# Patient Record
Sex: Male | Born: 1990 | Race: Black or African American | Hispanic: No | Marital: Single | Smoking: Current every day smoker
Health system: Southern US, Community
[De-identification: ages and names within clinical notes are randomized; demographics above are authoritative.]

## PROBLEM LIST (undated history)

## (undated) DIAGNOSIS — R569 Unspecified convulsions: Secondary | ICD-10-CM

---
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
## 2012-06-16 ENCOUNTER — Emergency Department: Payer: Self-pay | Admitting: Emergency Medicine

## 2014-05-05 ENCOUNTER — Emergency Department: Payer: Self-pay | Admitting: Internal Medicine

## 2014-11-12 ENCOUNTER — Emergency Department (HOSPITAL_COMMUNITY): Payer: Self-pay

## 2014-11-12 ENCOUNTER — Emergency Department (HOSPITAL_COMMUNITY)
Admission: EM | Admit: 2014-11-12 | Discharge: 2014-11-13 | Disposition: A | Discharge status: Home / Self Care | Payer: Self-pay | Attending: Emergency Medicine | Admitting: Emergency Medicine

## 2014-11-12 ENCOUNTER — Encounter (HOSPITAL_COMMUNITY): Payer: Self-pay | Admitting: Emergency Medicine

## 2014-11-12 DIAGNOSIS — Y998 Other external cause status: Secondary | ICD-10-CM | POA: Insufficient documentation

## 2014-11-12 DIAGNOSIS — Z720 Tobacco use: Secondary | ICD-10-CM | POA: Insufficient documentation

## 2014-11-12 DIAGNOSIS — S00511A Abrasion of lip, initial encounter: Secondary | ICD-10-CM | POA: Insufficient documentation

## 2014-11-12 DIAGNOSIS — Y9389 Activity, other specified: Secondary | ICD-10-CM | POA: Insufficient documentation

## 2014-11-12 DIAGNOSIS — Y9259 Other trade areas as the place of occurrence of the external cause: Secondary | ICD-10-CM | POA: Insufficient documentation

## 2014-11-12 DIAGNOSIS — W07XXXA Fall from chair, initial encounter: Secondary | ICD-10-CM | POA: Insufficient documentation

## 2014-11-12 DIAGNOSIS — R569 Unspecified convulsions: Secondary | ICD-10-CM | POA: Insufficient documentation

## 2014-11-12 DIAGNOSIS — F1210 Cannabis abuse, uncomplicated: Secondary | ICD-10-CM | POA: Insufficient documentation

## 2014-11-12 DIAGNOSIS — S0083XA Contusion of other part of head, initial encounter: Secondary | ICD-10-CM | POA: Insufficient documentation

## 2014-11-12 DIAGNOSIS — F1310 Sedative, hypnotic or anxiolytic abuse, uncomplicated: Secondary | ICD-10-CM | POA: Insufficient documentation

## 2014-11-12 DIAGNOSIS — R001 Bradycardia, unspecified: Secondary | ICD-10-CM | POA: Insufficient documentation

## 2014-11-12 LAB — I-STAT CHEM 8, ED
BUN: 10 mg/dL (ref 6–20)
Calcium, Ion: 1.18 mmol/L (ref 1.12–1.23)
Chloride: 101 mmol/L (ref 101–111)
Creatinine, Ser: 1.1 mg/dL (ref 0.61–1.24)
GLUCOSE: 98 mg/dL (ref 65–99)
HEMATOCRIT: 46 % (ref 39.0–52.0)
HEMOGLOBIN: 15.6 g/dL (ref 13.0–17.0)
Potassium: 3.7 mmol/L (ref 3.5–5.1)
SODIUM: 140 mmol/L (ref 135–145)
TCO2: 22 mmol/L (ref 0–100)

## 2014-11-12 LAB — CBC WITH DIFFERENTIAL/PLATELET
BASOS PCT: 0 % (ref 0–1)
Basophils Absolute: 0 10*3/uL (ref 0.0–0.1)
EOS PCT: 2 % (ref 0–5)
Eosinophils Absolute: 0.1 10*3/uL (ref 0.0–0.7)
HEMATOCRIT: 42.5 % (ref 39.0–52.0)
HEMOGLOBIN: 14.8 g/dL (ref 13.0–17.0)
LYMPHS ABS: 0.7 10*3/uL (ref 0.7–4.0)
Lymphocytes Relative: 10 % — ABNORMAL LOW (ref 12–46)
MCH: 30.4 pg (ref 26.0–34.0)
MCHC: 34.8 g/dL (ref 30.0–36.0)
MCV: 87.3 fL (ref 78.0–100.0)
MONO ABS: 0.4 10*3/uL (ref 0.1–1.0)
Monocytes Relative: 6 % (ref 3–12)
Neutro Abs: 5.7 10*3/uL (ref 1.7–7.7)
Neutrophils Relative %: 83 % — ABNORMAL HIGH (ref 43–77)
Platelets: 117 10*3/uL — ABNORMAL LOW (ref 150–400)
RBC: 4.87 MIL/uL (ref 4.22–5.81)
RDW: 13.1 % (ref 11.5–15.5)
WBC: 7 10*3/uL (ref 4.0–10.5)

## 2014-11-12 LAB — RAPID URINE DRUG SCREEN, HOSP PERFORMED
Amphetamines: NOT DETECTED
BARBITURATES: NOT DETECTED
BENZODIAZEPINES: POSITIVE — AB
COCAINE: NOT DETECTED
Opiates: NOT DETECTED
TETRAHYDROCANNABINOL: POSITIVE — AB

## 2014-11-12 LAB — ETHANOL

## 2014-11-12 MED ORDER — SODIUM CHLORIDE 0.9 % IV BOLUS (SEPSIS)
1000.0000 mL | Freq: Once | INTRAVENOUS | Status: AC
Start: 2014-11-12 — End: 2014-11-13
  Administered 2014-11-12: 1000 mL via INTRAVENOUS

## 2014-11-12 NOTE — Discharge Instructions (Signed)
Bradycardia Bradycardia is a term for a heart rate (pulse) that, in adults, is slower than 60 beats per minute. A normal rate is 60 to 100 beats per minute. A heart rate below 60 beats per minute may be normal for some adults with healthy hearts. If the rate is too slow, the heart may have trouble pumping the volume of blood the body needs. If the heart rate gets too low, blood flow to the brain may be decreased and may make you feel lightheaded, dizzy, or faint. The heart has a natural pacemaker in the top of the heart called the SA node (sinoatrial or sinus node). This pacemaker sends out regular electrical signals to the muscle of the heart, telling the heart muscle when to beat (contract). The electrical signal travels from the upper parts of the heart (atria) through the AV node (atrioventricular node), to the lower chambers of the heart (ventricles). The ventricles squeeze, pumping the blood from your heart to your lungs and to the rest of your body. CAUSES   Problem with the heart's electrical system.  Problem with the heart's natural pacemaker.  Heart disease, damage, or infection.  Medications.  Problems with minerals and salts (electrolytes). SYMPTOMS   Fainting (syncope).  Fatigue and weakness.  Shortness of breath (dyspnea).  Chest pain (angina).  Drowsiness.  Confusion. DIAGNOSIS   An electrocardiogram (ECG) can help your caregiver determine the type of slow heart rate you have.  If the cause is not seen on an ECG, you may need to wear a heart monitor that records your heart rhythm for several hours or days.  Blood tests. TREATMENT   Electrolyte supplements.  Medications.  Withholding medication which is causing a slow heart rate.  Pacemaker placement. SEEK IMMEDIATE MEDICAL CARE IF:   You feel lightheaded or faint.  You develop an irregular heart rate.  You feel chest pain or have trouble breathing. MAKE SURE YOU:   Understand these  instructions.  Will watch your condition.  Will get help right away if you are not doing well or get worse. Document Released: 02/21/2002 Document Revised: 08/24/2011 Document Reviewed: 09/06/2013 [facility] Patient Information 2015 [place], [place]. This information is not intended to replace advice given to you by your health care provider. Make sure you discuss any questions you have with your health care provider. No definitive cause for your seizure was identified tonight, you had a normal head CT your lab work was all normal you were noted to have some bradycardia or slow heart rate U been given a referral to cardiology.  Please call in the morning and make an appointment if you develop new or worsening symptoms between now and your appointment time.  Please return to the emergency room for further evaluation

## 2014-11-12 NOTE — ED Notes (Addendum)
Herbert Griffin(mom): [phone].

## 2014-11-12 NOTE — ED Notes (Signed)
Per EMS, [place] when his he had a tonic-clonic seizure, lasting approximately 1 minute. This was witnessed by a friend. Upon EMS arrival, pt was diaphoretic and post-ictal. Pt has a hematoma on his right forehead, and EMS reports that the pt had a bloody nose. Pt denies nausea and vomiting.

## 2014-11-12 NOTE — ED Provider Notes (Signed)
CSN: [id number]     Arrival date & time 11/12/14  2002 History   First MD Initiated Contact with Patient 11/12/14 2007     Chief Complaint  Patient presents with  . Seizures     (Consider location/radiation/quality/duration/timing/severity/associated sxs/prior Treatment) HPI Comments: By EMS after friends called for reported seizuretoday.  [place] the [place] and he fell sideways out of his chair had generalized body shaking lasting approximately 1 unit.  It took approximately 30-45 minutes to come back to baseline.  On arrival to the emergency department.  He is at his baseline.  He states that he does not have a history of seizures.  He states that he has not had a lot to eat or drink today.  He may be dehydrated.  He denies regular daily drinking regularly daily drug use.  States last he had any Beer was several days ago and he smokes marijuana several times a week. Patient denies any family history of cardiac disease, diabetes, seizure disorder.  Sister pulled me aside after initial interview and states that the patient uses marijuana on a daily basis as well as street drugs that he buys daily.  He drinks alcohol and beer on a daily basis.  He's been doing this for several years.  Patient is a 24 y.o. male presenting with seizures. The history is provided by the patient and a friend.  Seizures Seizure activity on arrival: no   Seizure type:  Myoclonic Initial focality:  Unable to specify Episode characteristics: confusion, disorientation and generalized shaking   Episode characteristics: no incontinence and no tongue biting   Postictal symptoms: confusion   Return to baseline: yes   Severity:  Unable to specify Duration:  1 minute Timing:  Once Progression:  Resolved Recent head injury:  No recent head injuries History of seizures: no     History reviewed. No pertinent past medical history. History reviewed. No pertinent past surgical history. History reviewed. No  pertinent family history. History  Substance Use Topics  . Smoking status: Current Every Day Smoker  . Smokeless tobacco: Not on file  . Alcohol Use: Yes     Comment: Occassionally    Review of Systems  Constitutional: Negative for fever.  Respiratory: Negative for shortness of breath.   Cardiovascular: Negative for chest pain.  Gastrointestinal: Negative for nausea, vomiting and abdominal pain.  Skin: Positive for wound.  Neurological: Positive for seizures. Negative for dizziness, weakness, numbness and headaches.  All other systems reviewed and are negative.     Allergies  Review of patient's allergies indicates no known allergies.  Home Medications   Prior to Admission medications   Not on File   BP 125/79 mmHg  Pulse 51  Temp(Src) 98.4 F (36.9 C) (Oral)  Resp 14  SpO2 100% Physical Exam  Constitutional: He is oriented to person, place, and time. He appears well-developed and well-nourished.  HENT:  Right Ear: External ear normal.  Left Ear: External ear normal.  Mouth/Throat: Oropharynx is clear and moist.  Eyes: Pupils are equal, round, and reactive to light.  Neck: Normal range of motion. No spinous process tenderness and no muscular tenderness present. Normal range of motion present.  Cardiovascular: Regular rhythm.  Bradycardia present.   Pulmonary/Chest: Effort normal and breath sounds normal.  Abdominal: Soft. He exhibits no distension. There is no tenderness.  Musculoskeletal: Normal range of motion. He exhibits no edema or tenderness.  Lymphadenopathy:    He has no cervical adenopathy.  Neurological: He  is alert and oriented to person, place, and time.  Skin: Skin is warm.  Hematoma right upper forehead abrasion to upper lip midline  Nursing note and vitals reviewed.   ED Course  Procedures (including critical care time) Labs Review Labs Reviewed  CBC WITH DIFFERENTIAL/PLATELET - Abnormal; Notable for the following:    Platelets 117 (*)     Neutrophils Relative % 83 (*)    Lymphocytes Relative 10 (*)    All other components within normal limits  URINE RAPID DRUG SCREEN (HOSP PERFORMED) NOT AT [facility] - Abnormal; Notable for the following:    Benzodiazepines POSITIVE (*)    Tetrahydrocannabinol POSITIVE (*)    All other components within normal limits  ETHANOL  I-STAT CHEM 8, ED    Imaging Review Ct Head Wo Contrast  11/12/2014   CLINICAL DATA:  Witnessed tonic colonic seizure for 1 minute.  EXAM: CT HEAD WITHOUT CONTRAST  TECHNIQUE: Contiguous axial images were obtained from the base of the skull through the vertex without intravenous contrast.  COMPARISON:  None.  FINDINGS: No intracranial hemorrhage, mass effect, or midline shift. No hydrocephalus. The basilar cisterns are patent. No evidence of territorial infarct. No intracranial fluid collection. Small right frontal scalp hematoma. Calvarium is intact. There is diffuse mucosal thickening of paranasal sinuses with circumferential involvement of the right maxillary sinus, small fluid level in the left side of sphenoid sinus and left maxillary sinus. Mastoid air cells are well aerated.  IMPRESSION: 1. No acute intracranial abnormality. Small right frontal scalp hematoma without fracture. 2. Near pan sinusitis, with likely acute component on the left.   Electronically Signed   By: Rubye Oaks M.D.   On: 11/12/2014 22:15     EKG Interpretation   Date/Time:  Monday Nov 12 2014 20:06:58 EDT Ventricular Rate:  48 PR Interval:  178 QRS Duration: 85 QT Interval:  398 QTC Calculation: 355 R Axis:   64 Text Interpretation:  Age not entered, assumed to be  24 years old for  purpose of ECG interpretation Sinus bradycardia Consider left ventricular  hypertrophy Minimal ST elevation, inferior leads No prior  for comparison  Confirmed by Gwendolyn Grant  MD, BLAIR ([id number]) on 11/12/2014 8:20:14 PM     Patient with no history of seizures was brought in after having a witnessed seizure lasted  approximately 1 minute with a 45 minute recovery.  Negative workup in the emergency department, although he was found to be bradycardic with pulses dropping into the 30s, although he was asymptomatic.  He has been given a referral to cardiology for follow-up.  Patient understands this. MDM   Final diagnoses:  Seizure  Bradycardia         Earley Favor, NP 11/13/14 0002  Elwin Mocha, MD 11/13/14 [phone]

## 2014-11-14 ENCOUNTER — Telehealth (HOSPITAL_BASED_OUTPATIENT_CLINIC_OR_DEPARTMENT_OTHER): Payer: Self-pay | Admitting: Emergency Medicine

## 2015-10-30 ENCOUNTER — Emergency Department
Admission: EM | Admit: 2015-10-30 | Discharge: 2015-10-30 | Disposition: A | Discharge status: Home / Self Care | Payer: Self-pay | Attending: Emergency Medicine | Admitting: Emergency Medicine

## 2015-10-30 ENCOUNTER — Emergency Department: Payer: Self-pay

## 2015-10-30 ENCOUNTER — Encounter: Payer: Self-pay | Admitting: *Deleted

## 2015-10-30 DIAGNOSIS — F1290 Cannabis use, unspecified, uncomplicated: Secondary | ICD-10-CM | POA: Insufficient documentation

## 2015-10-30 DIAGNOSIS — F17200 Nicotine dependence, unspecified, uncomplicated: Secondary | ICD-10-CM | POA: Insufficient documentation

## 2015-10-30 DIAGNOSIS — R569 Unspecified convulsions: Secondary | ICD-10-CM | POA: Insufficient documentation

## 2015-10-30 HISTORY — DX: Unspecified convulsions: R56.9

## 2015-10-30 LAB — BASIC METABOLIC PANEL
Anion gap: 11 (ref 5–15)
BUN: 24 mg/dL — AB (ref 6–20)
CALCIUM: 9.2 mg/dL (ref 8.9–10.3)
CHLORIDE: 102 mmol/L (ref 101–111)
CO2: 23 mmol/L (ref 22–32)
Creatinine, Ser: 1.34 mg/dL — ABNORMAL HIGH (ref 0.61–1.24)
GFR calc non Af Amer: >60 mL/min (ref >60)
Glucose, Bld: 105 mg/dL — ABNORMAL HIGH (ref 65–99)
Potassium: 4.3 mmol/L (ref 3.5–5.1)
Sodium: 136 mmol/L (ref 135–145)

## 2015-10-30 LAB — CBC WITH DIFFERENTIAL/PLATELET
BASOS PCT: 1 %
Basophils Absolute: 0 10*3/uL (ref 0–0.1)
EOS ABS: 0.4 10*3/uL (ref 0–0.7)
EOS PCT: 6 %
HCT: 43.2 % (ref 40.0–52.0)
Hemoglobin: 14.3 g/dL (ref 13.0–18.0)
LYMPHS ABS: 1.6 10*3/uL (ref 1.0–3.6)
Lymphocytes Relative: 23 %
MCH: 29.1 pg (ref 26.0–34.0)
MCHC: 33.1 g/dL (ref 32.0–36.0)
MCV: 88.1 fL (ref 80.0–100.0)
Monocytes Absolute: 0.6 10*3/uL (ref 0.2–1.0)
Monocytes Relative: 9 %
Neutro Abs: 4.3 10*3/uL (ref 1.4–6.5)
Neutrophils Relative %: 61 %
PLATELETS: 120 10*3/uL — AB (ref 150–440)
RBC: 4.91 MIL/uL (ref 4.40–5.90)
RDW: 14.6 % — ABNORMAL HIGH (ref 11.5–14.5)
WBC: 6.9 10*3/uL (ref 3.8–10.6)

## 2015-10-30 MED ORDER — LEVETIRACETAM 500 MG PO TABS: 500.0000 mg | ORAL_TABLET | Freq: Two times a day (BID) | ORAL | Status: DC

## 2015-10-30 MED ORDER — LEVETIRACETAM 500 MG/5ML IV SOLN
1000.0000 mg | Freq: Once | INTRAVENOUS | Status: AC
  Administered 2015-10-30: 1000 mg via INTRAVENOUS
  Filled 2015-10-30: qty 10

## 2015-10-30 MED ORDER — SODIUM CHLORIDE 0.9 % IV BOLUS (SEPSIS)
1000.0000 mL | Freq: Once | INTRAVENOUS | Status: AC
  Administered 2015-10-30: 1000 mL via INTRAVENOUS

## 2015-10-30 NOTE — ED Notes (Signed)
Patient transported to CT 
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
## 2015-10-30 NOTE — Discharge Instructions (Signed)
Do not drive until you are cleared to do so by your neurologist. °Seizure, Adult °A seizure is abnormal electrical activity in the brain. Seizures usually last from 30 seconds to 2 minutes. There are various types of seizures. °Before a seizure, you may have a warning sensation (aura) that a seizure is about to occur. An aura may include the following symptoms:  °· Fear or anxiety. °· Nausea. °· Feeling like the room is spinning (vertigo). °· Vision changes, such as seeing flashing lights or spots. °Common symptoms during a seizure include: °· A change in attention or behavior (altered mental status). °· Convulsions with rhythmic jerking movements. °· Drooling. °· Rapid eye movements. °· Grunting. °· Loss of bladder and bowel control. °· Bitter taste in the mouth. °· Tongue biting. °After a seizure, you may feel confused and sleepy. You may also have an injury resulting from convulsions during the seizure. °HOME CARE INSTRUCTIONS  °· If you are given medicines, take them exactly as prescribed by your health care provider. °· Keep all follow-up appointments as directed by your health care provider. °· Do not swim or drive or engage in risky activity during which a seizure could cause further injury to you or others until your health care provider says it is OK. °· Get adequate rest. °· Teach friends and family what to do if you have a seizure. They should: °¨ Lay you on the ground to prevent a fall. °¨ Put a cushion under your head. °¨ Loosen any tight clothing around your neck. °¨ Turn you on your side. If vomiting occurs, this helps keep your airway clear. °¨ Stay with you until you recover. °¨ Know whether or not you need emergency care. °SEEK IMMEDIATE MEDICAL CARE IF: °· The seizure lasts longer than 5 minutes. °· The seizure is severe or you do not wake up immediately after the seizure. °· You have an altered mental status after the seizure. °· You are having more frequent or worsening seizures. °Someone should  drive you to the emergency department or call local emergency services (911 in [place] °MAKE SURE YOU: °· Understand these instructions. °· Will watch your condition. °· Will get help right away if you are not doing well or get worse. °  °This information is not intended to replace advice given to you by your health care provider. Make sure you discuss any questions you have with your health care provider. °  °Document Released: 05/29/2000 Document Revised: 06/22/2014 Document Reviewed: 01/11/2013 °Elsevier Interactive Patient Education ©2016 [employer]. ° °
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
## 2015-11-03 DIAGNOSIS — Z5321 Procedure and treatment not carried out due to patient leaving prior to being seen by health care provider: Secondary | ICD-10-CM | POA: Insufficient documentation

## 2015-11-03 DIAGNOSIS — R569 Unspecified convulsions: Secondary | ICD-10-CM | POA: Insufficient documentation

## 2015-11-03 LAB — COMPREHENSIVE METABOLIC PANEL
ALT: 19 U/L (ref 17–63)
ANION GAP: 4 — AB (ref 5–15)
AST: 21 U/L (ref 15–41)
Albumin: 5 g/dL (ref 3.5–5.0)
Alkaline Phosphatase: 59 U/L (ref 38–126)
BUN: 18 mg/dL (ref 6–20)
CO2: 32 mmol/L (ref 22–32)
Calcium: 9.7 mg/dL (ref 8.9–10.3)
Chloride: 103 mmol/L (ref 101–111)
Creatinine, Ser: 0.89 mg/dL (ref 0.61–1.24)
Glucose, Bld: 85 mg/dL (ref 65–99)
Potassium: 3.9 mmol/L (ref 3.5–5.1)
Sodium: 139 mmol/L (ref 135–145)
TOTAL PROTEIN: 7.9 g/dL (ref 6.5–8.1)
Total Bilirubin: 0.7 mg/dL (ref 0.3–1.2)

## 2015-11-03 LAB — CBC
HCT: 43.2 % (ref 40.0–52.0)
HEMOGLOBIN: 14.4 g/dL (ref 13.0–18.0)
MCH: 29.3 pg (ref 26.0–34.0)
MCHC: 33.4 g/dL (ref 32.0–36.0)
MCV: 87.7 fL (ref 80.0–100.0)
Platelets: 117 10*3/uL — ABNORMAL LOW (ref 150–440)
RBC: 4.93 MIL/uL (ref 4.40–5.90)
RDW: 14.3 % (ref 11.5–14.5)
WBC: 9.3 10*3/uL (ref 3.8–10.6)

## 2015-11-03 LAB — ETHANOL

## 2015-11-03 LAB — GLUCOSE, CAPILLARY: Glucose-Capillary: 79 mg/dL (ref 65–99)

## 2015-11-03 NOTE — ED Notes (Signed)
Pt states that tonight he was laying down on the bed and felt like something was wrong and felt like he was seizing again, pt states that he was here the other day for the same, pt denies biting his tongue or wetting his pants. Pt also states that his headaches have ben really bad. Pt states that he got really mad before it happened and thinks that may have brought it on, pt also states that his headache is worse in the right frontal lobe

## 2015-11-03 NOTE — ED Notes (Signed)
Pt seen walking outside with friends

## 2015-11-04 ENCOUNTER — Emergency Department
Admission: EM | Admit: 2015-11-04 | Discharge: 2015-11-04 | Disposition: A | Discharge status: Home / Self Care | Payer: Self-pay | Attending: Student | Admitting: Student

## 2015-11-04 LAB — URINE DRUG SCREEN, QUALITATIVE (ARMC ONLY)
AMPHETAMINES, UR SCREEN: NOT DETECTED
BENZODIAZEPINE, UR SCRN: POSITIVE — AB
Barbiturates, Ur Screen: NOT DETECTED
Cannabinoid 50 Ng, Ur ~~LOC~~: POSITIVE — AB
Cocaine Metabolite,Ur ~~LOC~~: NOT DETECTED
MDMA (Ecstasy)Ur Screen: NOT DETECTED
Methadone Scn, Ur: NOT DETECTED
Opiate, Ur Screen: NOT DETECTED
Phencyclidine (PCP) Ur S: NOT DETECTED
Tricyclic, Ur Screen: NOT DETECTED

## 2016-01-16 ENCOUNTER — Emergency Department
Admission: EM | Admit: 2016-01-16 | Discharge: 2016-01-16 | Disposition: A | Discharge status: Home / Self Care | Payer: No Typology Code available for payment source | Attending: Emergency Medicine | Admitting: Emergency Medicine

## 2016-01-16 ENCOUNTER — Emergency Department: Payer: No Typology Code available for payment source

## 2016-01-16 ENCOUNTER — Encounter: Payer: Self-pay | Admitting: Emergency Medicine

## 2016-01-16 DIAGNOSIS — Z8669 Personal history of other diseases of the nervous system and sense organs: Secondary | ICD-10-CM | POA: Diagnosis not present

## 2016-01-16 DIAGNOSIS — R10812 Left upper quadrant abdominal tenderness: Secondary | ICD-10-CM | POA: Diagnosis not present

## 2016-01-16 DIAGNOSIS — F17200 Nicotine dependence, unspecified, uncomplicated: Secondary | ICD-10-CM | POA: Diagnosis not present

## 2016-01-16 DIAGNOSIS — S0990XA Unspecified injury of head, initial encounter: Secondary | ICD-10-CM | POA: Diagnosis present

## 2016-01-16 DIAGNOSIS — Y999 Unspecified external cause status: Secondary | ICD-10-CM | POA: Diagnosis not present

## 2016-01-16 DIAGNOSIS — Y929 Unspecified place or not applicable: Secondary | ICD-10-CM | POA: Diagnosis not present

## 2016-01-16 DIAGNOSIS — Y939 Activity, unspecified: Secondary | ICD-10-CM | POA: Diagnosis not present

## 2016-01-16 DIAGNOSIS — S5002XA Contusion of left elbow, initial encounter: Secondary | ICD-10-CM | POA: Diagnosis not present

## 2016-01-16 DIAGNOSIS — S80212A Abrasion, left knee, initial encounter: Secondary | ICD-10-CM | POA: Diagnosis not present

## 2016-01-16 LAB — COMPREHENSIVE METABOLIC PANEL
ALK PHOS: 57 U/L (ref 38–126)
ALT: 13 U/L — AB (ref 17–63)
AST: 17 U/L (ref 15–41)
Albumin: 4.1 g/dL (ref 3.5–5.0)
Anion gap: 7 (ref 5–15)
BILIRUBIN TOTAL: 0.5 mg/dL (ref 0.3–1.2)
BUN: 16 mg/dL (ref 6–20)
CALCIUM: 8.8 mg/dL — AB (ref 8.9–10.3)
CO2: 30 mmol/L (ref 22–32)
CREATININE: 0.98 mg/dL (ref 0.61–1.24)
Chloride: 103 mmol/L (ref 101–111)
GFR calc Af Amer: >60 mL/min (ref >60)
Glucose, Bld: 98 mg/dL (ref 65–99)
Potassium: 3.5 mmol/L (ref 3.5–5.1)
Sodium: 140 mmol/L (ref 135–145)
TOTAL PROTEIN: 6.8 g/dL (ref 6.5–8.1)

## 2016-01-16 LAB — CBC
HCT: 42.2 % (ref 40.0–52.0)
HEMOGLOBIN: 14.7 g/dL (ref 13.0–18.0)
MCH: 30.3 pg (ref 26.0–34.0)
MCHC: 34.7 g/dL (ref 32.0–36.0)
MCV: 87.2 fL (ref 80.0–100.0)
Platelets: 122 10*3/uL — ABNORMAL LOW (ref 150–440)
RBC: 4.85 MIL/uL (ref 4.40–5.90)
RDW: 13.8 % (ref 11.5–14.5)
WBC: 6.7 10*3/uL (ref 3.8–10.6)

## 2016-01-16 MED ORDER — MORPHINE SULFATE (PF) 2 MG/ML IV SOLN
2.0000 mg | Freq: Once | INTRAVENOUS | Status: AC
  Administered 2016-01-16: 2 mg via INTRAVENOUS
  Filled 2016-01-16: qty 1

## 2016-01-16 MED ORDER — ONDANSETRON HCL 4 MG/2ML IJ SOLN
4.0000 mg | Freq: Once | INTRAMUSCULAR | Status: AC
  Administered 2016-01-16: 4 mg via INTRAVENOUS
  Filled 2016-01-16: qty 2

## 2016-01-16 MED ORDER — IOPAMIDOL (ISOVUE-300) INJECTION 61%
100.0000 mL | Freq: Once | INTRAVENOUS | Status: AC | PRN
  Administered 2016-01-16: 100 mL via INTRAVENOUS

## 2016-01-16 MED ORDER — IBUPROFEN 800 MG PO TABS: 800.0000 mg | ORAL_TABLET | Freq: Three times a day (TID) | ORAL | 0 refills | Status: DC | PRN
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
## 2016-01-18 ENCOUNTER — Emergency Department
Admission: EM | Admit: 2016-01-18 | Discharge: 2016-01-18 | Disposition: A | Discharge status: Home / Self Care | Payer: Self-pay | Attending: Emergency Medicine | Admitting: Emergency Medicine

## 2016-01-18 ENCOUNTER — Emergency Department: Payer: Self-pay

## 2016-01-18 ENCOUNTER — Encounter: Payer: Self-pay | Admitting: Emergency Medicine

## 2016-01-18 DIAGNOSIS — Y92410 Unspecified street and highway as the place of occurrence of the external cause: Secondary | ICD-10-CM | POA: Insufficient documentation

## 2016-01-18 DIAGNOSIS — Y999 Unspecified external cause status: Secondary | ICD-10-CM | POA: Insufficient documentation

## 2016-01-18 DIAGNOSIS — S43402A Unspecified sprain of left shoulder joint, initial encounter: Secondary | ICD-10-CM | POA: Insufficient documentation

## 2016-01-18 DIAGNOSIS — Y939 Activity, unspecified: Secondary | ICD-10-CM | POA: Insufficient documentation

## 2016-01-18 MED ORDER — NAPROXEN 500 MG PO TABS: 500.0000 mg | ORAL_TABLET | Freq: Two times a day (BID) | ORAL | 2 refills | Status: DC
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
## 2016-08-13 ENCOUNTER — Emergency Department (HOSPITAL_COMMUNITY)
Admission: EM | Admit: 2016-08-13 | Discharge: 2016-08-13 | Disposition: A | Discharge status: Home / Self Care | Payer: Self-pay | Attending: Emergency Medicine | Admitting: Emergency Medicine

## 2016-08-13 ENCOUNTER — Encounter (HOSPITAL_COMMUNITY): Payer: Self-pay | Admitting: *Deleted

## 2016-08-13 ENCOUNTER — Emergency Department (HOSPITAL_COMMUNITY): Payer: Self-pay

## 2016-08-13 DIAGNOSIS — W07XXXA Fall from chair, initial encounter: Secondary | ICD-10-CM | POA: Insufficient documentation

## 2016-08-13 DIAGNOSIS — S0083XA Contusion of other part of head, initial encounter: Secondary | ICD-10-CM | POA: Insufficient documentation

## 2016-08-13 DIAGNOSIS — Z23 Encounter for immunization: Secondary | ICD-10-CM | POA: Insufficient documentation

## 2016-08-13 DIAGNOSIS — Y9289 Other specified places as the place of occurrence of the external cause: Secondary | ICD-10-CM | POA: Insufficient documentation

## 2016-08-13 DIAGNOSIS — Y939 Activity, unspecified: Secondary | ICD-10-CM | POA: Insufficient documentation

## 2016-08-13 DIAGNOSIS — Y999 Unspecified external cause status: Secondary | ICD-10-CM | POA: Insufficient documentation

## 2016-08-13 DIAGNOSIS — G40909 Epilepsy, unspecified, not intractable, without status epilepticus: Secondary | ICD-10-CM | POA: Insufficient documentation

## 2016-08-13 DIAGNOSIS — Z79899 Other long term (current) drug therapy: Secondary | ICD-10-CM | POA: Insufficient documentation

## 2016-08-13 LAB — I-STAT CHEM 8, ED
BUN: 16 mg/dL (ref 6–20)
CHLORIDE: 104 mmol/L (ref 101–111)
CREATININE: 0.9 mg/dL (ref 0.61–1.24)
Calcium, Ion: 1.16 mmol/L (ref 1.15–1.40)
Glucose, Bld: 83 mg/dL (ref 65–99)
HEMATOCRIT: 40 % (ref 39.0–52.0)
Hemoglobin: 13.6 g/dL (ref 13.0–17.0)
POTASSIUM: 4 mmol/L (ref 3.5–5.1)
SODIUM: 140 mmol/L (ref 135–145)
TCO2: 21 mmol/L (ref 0–100)

## 2016-08-13 LAB — CBC WITH DIFFERENTIAL/PLATELET
BASOS ABS: 0 10*3/uL (ref 0.0–0.1)
Basophils Relative: 0 %
Eosinophils Absolute: 0.3 10*3/uL (ref 0.0–0.7)
Eosinophils Relative: 3 %
HEMATOCRIT: 39.2 % (ref 39.0–52.0)
HEMOGLOBIN: 13.6 g/dL (ref 13.0–17.0)
Lymphocytes Relative: 10 %
Lymphs Abs: 0.8 10*3/uL (ref 0.7–4.0)
MCH: 29.4 pg (ref 26.0–34.0)
MCHC: 34.7 g/dL (ref 30.0–36.0)
MCV: 84.7 fL (ref 78.0–100.0)
MONOS PCT: 7 %
Monocytes Absolute: 0.6 10*3/uL (ref 0.1–1.0)
NEUTROS ABS: 6.4 10*3/uL (ref 1.7–7.7)
NEUTROS PCT: 80 %
Platelets: 120 10*3/uL — ABNORMAL LOW (ref 150–400)
RBC: 4.63 MIL/uL (ref 4.22–5.81)
RDW: 13.1 % (ref 11.5–15.5)
WBC: 8.1 10*3/uL (ref 4.0–10.5)

## 2016-08-13 MED ORDER — TETANUS-DIPHTH-ACELL PERTUSSIS 5-2.5-18.5 LF-MCG/0.5 IM SUSP
0.5000 mL | Freq: Once | INTRAMUSCULAR | Status: AC
  Administered 2016-08-13: 0.5 mL via INTRAMUSCULAR
  Filled 2016-08-13: qty 0.5

## 2016-08-13 MED ORDER — LEVETIRACETAM 500 MG/5ML IV SOLN
1000.0000 mg | Freq: Once | INTRAVENOUS | Status: AC
  Administered 2016-08-13: 1000 mg via INTRAVENOUS
  Filled 2016-08-13: qty 10

## 2016-08-13 MED ORDER — LEVETIRACETAM 500 MG PO TABS: 500.0000 mg | ORAL_TABLET | Freq: Two times a day (BID) | ORAL | 0 refills | Status: DC

## 2016-08-13 NOTE — ED Provider Notes (Signed)
[facility]-EMERGENCY DEPT Provider Note   CSN: [id number] Arrival date & time: 08/13/16  0350     History   Chief Complaint Chief Complaint  Patient presents with  . Seizures    HPI Herbert Griffin is a 26 y.o. male.  The history is provided by the patient.  Seizures   This is a recurrent problem. The current episode started 1 to 2 hours ago. The problem has been resolved. There was 1 seizure. The most recent episode lasted 30 to 120 seconds. Associated symptoms include sleepiness. Pertinent negatives include no headaches, no speech difficulty and no chest pain. Characteristics include rhythmic jerking. The episode was witnessed. There was no sensation of an aura present. The seizures did not continue in the ED. The seizure(s) had no focality. Possible causes include missed seizure meds and change in alcohol use. There has been no fever. There were no medications administered prior to arrival.    Past Medical History:  Diagnosis Date  . Seizures (HCC)     There are no active problems to display for this patient.   History reviewed. No pertinent surgical history.     Home Medications    Prior to Admission medications   Medication Sig Start Date End Date Taking? Authorizing Provider  levETIRAcetam (KEPPRA) 500 MG tablet Take 500 mg by mouth daily.    Historical Provider, MD  naproxen (NAPROSYN) 500 MG tablet Take 1 tablet (500 mg total) by mouth 2 (two) times daily with a meal. 01/18/16   Jene Every, MD    Family History No family history on file.  Social History Social History  Substance Use Topics  . Smoking status: Never Smoker  . Smokeless tobacco: Never Used  . Alcohol use Yes     Comment: Occassionally     Allergies   Patient has no known allergies.   Review of Systems Review of Systems  Constitutional: Negative for diaphoresis and fever.  Respiratory: Negative for shortness of breath.   Cardiovascular: Negative for chest pain.  Neurological: Positive  for seizures. Negative for dizziness, tremors, facial asymmetry, speech difficulty, light-headedness, numbness and headaches.  All other systems reviewed and are negative.    Physical Exam Updated Vital Signs SpO2 96%   Physical Exam  Constitutional: He is oriented to person, place, and time. He appears well-developed and well-nourished. No distress.  HENT:  Head: Normocephalic. Head is without raccoon's eyes and without Battle's sign.  Right Ear: External ear normal. No hemotympanum.  Left Ear: External ear normal. No hemotympanum.  Mouth/Throat: No oropharyngeal exudate.  Cheek contusion  Eyes: EOM are normal. Pupils are equal, round, and reactive to light.  Neck: Normal range of motion. Neck supple.  Cardiovascular: Normal rate, regular rhythm and intact distal pulses.   Pulmonary/Chest: Effort normal and breath sounds normal. No respiratory distress. He has no wheezes. He has no rales.  Abdominal: Soft. Bowel sounds are normal. He exhibits no mass. There is no tenderness. There is no rebound and no guarding.  Musculoskeletal: Normal range of motion.  Neurological: He is alert and oriented to person, place, and time. No cranial nerve deficit.  Skin: Skin is warm and dry. Capillary refill takes less than 2 seconds.  Psychiatric: He has a normal mood and affect.     ED Treatments / Results  Labs (all labs ordered are listed, but only abnormal results are displayed) Labs Reviewed  CBC WITH DIFFERENTIAL/PLATELET  I-STAT CHEM 8, ED    EKG  EKG Interpretation None  Radiology No results found.  Procedures Procedures (including critical care time)  Medications Ordered in ED Medications  levETIRAcetam (KEPPRA) 1,000 mg in sodium chloride 0.9 % 100 mL IVPB (not administered)  Tdap (BOOSTRIX) injection 0.5 mL (not administered)    Final Clinical Impressions(s) / ED Diagnoses  Seizure disorder:  No alcohol.  Take medication as directed and patient was informed no  driving until cleared by neurology.  This was also printed on your discharge paperwork.  Patient acknowledged with verbally with ED tech present.  The patient is nontoxic-appearing on exam and vital signs are normal. After history, exam, and medical workup I feel the patient has been appropriately medically screened and is safe for discharge home. Pertinent diagnoses were discussed with the patient. Patient was given return precautions.  Strict return precautions for chest pain, dyspnea on exertion, shortness of breath, palpitations weakness, fever, numbness worsening swellingor any concerns. .  New Prescriptions New Prescriptions   No medications on file     Maureen Duesing, MD 08/13/16 0530

## 2016-08-13 NOTE — ED Notes (Signed)
Bed: WA24 Expected date:  Expected time:  Means of arrival:  Comments: 26 yo M  Seizure
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
## 2016-08-27 ENCOUNTER — Emergency Department
Admission: EM | Admit: 2016-08-27 | Discharge: 2016-08-27 | Disposition: A | Discharge status: Home / Self Care | Payer: Self-pay | Attending: Emergency Medicine | Admitting: Emergency Medicine

## 2016-08-27 ENCOUNTER — Encounter: Payer: Self-pay | Admitting: Emergency Medicine

## 2016-08-27 DIAGNOSIS — F1910 Other psychoactive substance abuse, uncomplicated: Secondary | ICD-10-CM | POA: Insufficient documentation

## 2016-08-27 DIAGNOSIS — F1994 Other psychoactive substance use, unspecified with psychoactive substance-induced mood disorder: Secondary | ICD-10-CM

## 2016-08-27 DIAGNOSIS — F1310 Sedative, hypnotic or anxiolytic abuse, uncomplicated: Secondary | ICD-10-CM

## 2016-08-27 DIAGNOSIS — Z79899 Other long term (current) drug therapy: Secondary | ICD-10-CM | POA: Insufficient documentation

## 2016-08-27 LAB — URINE DRUG SCREEN, QUALITATIVE (ARMC ONLY)
Amphetamines, Ur Screen: NOT DETECTED
BARBITURATES, UR SCREEN: NOT DETECTED
Benzodiazepine, Ur Scrn: POSITIVE — AB
CANNABINOID 50 NG, UR ~~LOC~~: POSITIVE — AB
Cocaine Metabolite,Ur ~~LOC~~: NOT DETECTED
MDMA (Ecstasy)Ur Screen: NOT DETECTED
Methadone Scn, Ur: NOT DETECTED
OPIATE, UR SCREEN: NOT DETECTED
PHENCYCLIDINE (PCP) UR S: NOT DETECTED
Tricyclic, Ur Screen: NOT DETECTED

## 2016-08-27 LAB — COMPREHENSIVE METABOLIC PANEL
ALK PHOS: 54 U/L (ref 38–126)
ALT: 15 U/L — ABNORMAL LOW (ref 17–63)
AST: 24 U/L (ref 15–41)
Albumin: 4.4 g/dL (ref 3.5–5.0)
Anion gap: 10 (ref 5–15)
BILIRUBIN TOTAL: 0.7 mg/dL (ref 0.3–1.2)
BUN: 22 mg/dL — AB (ref 6–20)
CALCIUM: 9 mg/dL (ref 8.9–10.3)
CO2: 25 mmol/L (ref 22–32)
CREATININE: 0.99 mg/dL (ref 0.61–1.24)
Chloride: 103 mmol/L (ref 101–111)
GFR calc Af Amer: >60 mL/min (ref >60)
Glucose, Bld: 91 mg/dL (ref 65–99)
POTASSIUM: 3.2 mmol/L — AB (ref 3.5–5.1)
Sodium: 138 mmol/L (ref 135–145)
TOTAL PROTEIN: 7.3 g/dL (ref 6.5–8.1)

## 2016-08-27 LAB — CBC
HEMATOCRIT: 41 % (ref 40.0–52.0)
Hemoglobin: 13.9 g/dL (ref 13.0–18.0)
MCH: 29.4 pg (ref 26.0–34.0)
MCHC: 33.8 g/dL (ref 32.0–36.0)
MCV: 86.8 fL (ref 80.0–100.0)
Platelets: 126 10*3/uL — ABNORMAL LOW (ref 150–440)
RBC: 4.73 MIL/uL (ref 4.40–5.90)
RDW: 13.8 % (ref 11.5–14.5)
WBC: 9.7 10*3/uL (ref 3.8–10.6)

## 2016-08-27 LAB — ACETAMINOPHEN LEVEL: Acetaminophen (Tylenol), Serum: <10 ug/mL — ABNORMAL LOW (ref 10–30)

## 2016-08-27 LAB — SALICYLATE LEVEL: Salicylate Lvl: <7 mg/dL (ref 2.8–30.0)

## 2016-08-27 LAB — ETHANOL

## 2016-08-27 NOTE — ED Provider Notes (Signed)
 -----------------------------------------   1:18 PM on 08/27/2016 -----------------------------------------  Case discussed with Dr. Toni Amendlapacs after his evaluation of the patient in the emergency department. Feels that this is a substance abuse issue but the patient is otherwise rational and not a danger to himself or others and not suicidal. He recommends the patient be discharged for outpatient follow-up with [facility] for substance abuse treatment. Patient remained medically stable with normal vital signs.   Sharman CheekPhillip Hendy Brindle, MD 08/27/16 [phone]

## 2016-08-27 NOTE — ED Triage Notes (Signed)
Pt brought to ED IVC, by [employer]. Pt detained in waiting room area due to aggressive behavior, ot brought straight back to RM 22 in forensic restraints and wheelchair, accompanied by [employer]. Report given by [employer], pts mother stated that pt has been abusing xanax and has made comments of self harm. Pt denies SI and HI at this time. Pt is hyperactive and refusing all commands. MD, RN, ODS and [employer] at bedside.

## 2016-08-27 NOTE — ED Notes (Signed)
Pt consents to blood work, urine sample and clothing change out. Male officers and staff at bedside for assistance, instructions and forensic handcuff removal.

## 2016-08-27 NOTE — ED Provider Notes (Signed)
[facility] Emergency Department Provider Note    First MD Initiated Contact with Patient 08/27/16 [phone]     (approximate)  I have reviewed the triage vital signs and the nursing notes.   HISTORY  Chief Complaint Suicidal   HPI Herbert Griffin is a 26 y.o. male with history of seizures presents to the emergency department in police custody involuntarily committed secondary concern for possible self-harm. Patient's mother's states that the patient is abusing Xanax and made statements that he didn't care if he was living anymore. Patient denies any suicidal ideation he does admit to taking the Xanax yesterday however none today. Patient does admit to smoking marijuana tonight.   Past Medical History:  Diagnosis Date  . Seizures (HCC)     There are no active problems to display for this patient.   History reviewed. No pertinent surgical history.  Prior to Admission medications   Medication Sig Start Date End Date Taking? Authorizing Provider  levETIRAcetam (KEPPRA) 500 MG tablet Take 1 tablet (500 mg total) by mouth 2 (two) times daily. 08/13/16   April Palumbo, MD  naproxen (NAPROSYN) 500 MG tablet Take 1 tablet (500 mg total) by mouth 2 (two) times daily with a meal. 01/18/16   Jene Every, MD    Allergies No known drug allergies History reviewed. No pertinent family history.  Social History Social History  Substance Use Topics  . Smoking status: Never Smoker  . Smokeless tobacco: Never Used  . Alcohol use Yes     Comment: Occassionally    Review of Systems Constitutional: No fever/chills Eyes: No visual changes. ENT: No sore throat. Cardiovascular: Denies chest pain. Respiratory: Denies shortness of breath. Gastrointestinal: No abdominal pain.  No nausea, no vomiting.  No diarrhea.  No constipation. Genitourinary: Negative for dysuria. Musculoskeletal: Negative for back pain. Skin: Negative for rash. Neurological: Negative for  headaches, focal weakness or numbness. Psychiatric:Involuntary commitment. Patient denies suicidal homicidal ideation.  10-point ROS otherwise negative.  ____________________________________________   PHYSICAL EXAM:  VITAL SIGNS: ED Triage Vitals [08/27/16 0406]  Enc Vitals Group     BP      Pulse      Resp      Temp      Temp src      SpO2      Weight 160 lb (72.6 kg)     Height      Head Circumference      Peak Flow      Pain Score      Pain Loc      Pain Edu?      Excl. in GC?     Constitutional: Alert and oriented. Patient very agitated. Eyes: Conjunctivae are normal. PERRL. EOMI. Head: Atraumatic. Mouth/Throat: Mucous membranes are moist.  Oropharynx non-erythematous. Neck: No stridor.   Cardiovascular: Normal rate, regular rhythm. Good peripheral circulation. Grossly normal heart sounds. Respiratory: Normal respiratory effort.  No retractions. Lungs CTAB. Gastrointestinal: Soft and nontender. No distention.  Musculoskeletal: No lower extremity tenderness nor edema. No gross deformities of extremities. Neurologic:  Normal speech and language. No gross focal neurologic deficits are appreciated.  Skin:  Skin is warm, dry and intact. No rash noted. Psychiatric: Agitated. Abnormal behavior  ____________________________________________   LABS (all labs ordered are listed, but only abnormal results are displayed)  Labs Reviewed  CBC - Abnormal; Notable for the following:       Result Value   Platelets 126 (*)    All other components within  normal limits  COMPREHENSIVE METABOLIC PANEL  ETHANOL  SALICYLATE LEVEL  ACETAMINOPHEN LEVEL  URINE DRUG SCREEN, QUALITATIVE ([facility] ONLY)      Procedures      INITIAL IMPRESSION / ASSESSMENT AND PLAN / ED COURSE  Pertinent labs & imaging results that were available during my care of the patient were reviewed by me and considered in my medical decision making (see chart for details).  Patient very upset and  agitated on arrival to the emergency department however after conversation patient calmed down. Patient willingly allowed to staff to obtain blood and urine samples. Await psychiatry consultation.    ____________________________________________  FINAL CLINICAL IMPRESSION(S) / ED DIAGNOSES  Final diagnoses:  Polysubstance abuse     MEDICATIONS GIVEN DURING THIS VISIT:  Medications - No data to display   NEW OUTPATIENT MEDICATIONS STARTED DURING THIS VISIT:  New Prescriptions   No medications on file    Modified Medications   No medications on file    Discontinued Medications   No medications on file     Note:  This document was prepared using Dragon voice recognition software and may include unintentional dictation errors.    Darci Currentandolph N Abrey Bradway, MD 08/27/16 0700

## 2016-08-27 NOTE — Consult Note (Signed)
[place] Psychiatry Consult   Reason for Consult:  Consult for 26 year old man brought in under commitment filed by his mother and alleged suicidal threats. Referring Physician:  Joni Fears Patient Identification: Herbert Griffin MRN:  [id number] Principal Diagnosis: Substance induced mood disorder [facility]) Diagnosis:   Patient Active Problem List   Diagnosis Date Noted  . Substance induced mood disorder ([place]) [F19.94] 08/27/2016  . Benzodiazepine abuse [F13.10] 08/27/2016    Total Time spent with patient: 1 hour  Subjective:   Herbert Griffin is a 26 y.o. male patient admitted with "my mom just didn't understand".  HPI:  Patient interviewed. Chart reviewed. Commitment paperwork reports that the patient had made statements that were thought to imply suicidality and that he was abusing Xanax. Patient states that he made a post on Facebook regarding a friend of his who died in July 05, 2022 but he denied that there was anything about it that suggested suicidality. Patient completely denies that he has had any suicidal thoughts at all recently. He said that he lost a close friend of his in 2022-07-05 to drug overdose and he is still sad about that but overall he is feeling all right. He says he has many positive things going on in his life. He admits that his sleep pattern is erratic. Denies any hallucinations. Denies any medical symptoms. Patient admitted that he had taken 1 Xanax the night before last and that he smokes marijuana regularly. He denied that he is heavily abusing Xanax or any other drugs. Patient presented as calm and appropriate with full range of affect during the interview. Drug screen is positive for benzodiazepines and cannabis.  Medical history: Patient has a diagnosis of seizures. He tells me that he has only had 2 seizures total in his life time one of them recently. He just started in the last year. Unclear how much it's been worked up but he is supposed to be taking Keppra for  it. From his history it sounds very likely that alcohol and benzodiazepine withdrawal are the etiology for his seizures or at least a major contribution.  Social history: Lives with his mother and 3 younger sisters. Does a little bit of work under the table. Says he still has a good social life.  Substance abuse history: Says he uses marijuana regularly and does not see any harm in it at all. Admits that he takes Xanax occasionally but minimizes it and doesn't think it's abusive. Denies other drug use.  Past Psychiatric History: Denies any past psychiatric history as an adult although he says that as a child he was diagnosed with bipolar disorder. He says that later on at the end of his childhood he was told that that diagnosis no longer applied and has not seen anyone for mental health treatment since then. No adult psychiatric hospitalizations. No history of hallucinations denies any history of suicide attempts or violence.  Risk to Self: Is patient at risk for suicide?: No Risk to Others:   Prior Inpatient Therapy:   Prior Outpatient Therapy:    Past Medical History:  Past Medical History:  Diagnosis Date  . Seizures ([place])    History reviewed. No pertinent surgical history. Family History: History reviewed. No pertinent family history. Family Psychiatric  History: He says his mother has problems with anxiety Social History:  History  Alcohol Use  . Yes    Comment: Occassionally     History  Drug Use  . Types: Marijuana    Comment: Last use yesterday  Social History   Social History  . Marital status: Single    Spouse name: N/A  . Number of children: N/A  . Years of education: N/A   Social History Main Topics  . Smoking status: Never Smoker  . Smokeless tobacco: Never Used  . Alcohol use Yes     Comment: Occassionally  . Drug use: Yes    Types: Marijuana     Comment: Last use yesterday  . Sexual activity: Not Asked   Other Topics Concern  . None   Social  History Narrative  . None   Additional Social History:    Allergies:  No Known Allergies  Labs:  Results for orders placed or performed during the hospital encounter of 08/27/16 (from the past 48 hour(s))  Comprehensive metabolic panel     Status: Abnormal   Collection Time: 08/27/16  3:26 AM  Result Value Ref Range   Sodium 138 135 - 145 mmol/L   Potassium 3.2 (L) 3.5 - 5.1 mmol/L   Chloride 103 101 - 111 mmol/L   CO2 25 22 - 32 mmol/L   Glucose, Bld 91 65 - 99 mg/dL   BUN 22 (H) 6 - 20 mg/dL   Creatinine, Ser 0.99 0.61 - 1.24 mg/dL   Calcium 9.0 8.9 - 10.3 mg/dL   Total Protein 7.3 6.5 - 8.1 g/dL   Albumin 4.4 3.5 - 5.0 g/dL   AST 24 15 - 41 U/L   ALT 15 (L) 17 - 63 U/L   Alkaline Phosphatase 54 38 - 126 U/L   Total Bilirubin 0.7 0.3 - 1.2 mg/dL   GFR calc non Af Amer >60 >60 mL/min   GFR calc Af Amer >60 >60 mL/min    Comment: (NOTE) The eGFR has been calculated using the CKD EPI equation. This calculation has not been validated in all clinical situations. eGFR's persistently <60 mL/min signify possible Chronic Kidney Disease.    Anion gap 10 5 - 15  Ethanol     Status: None   Collection Time: 08/27/16  3:26 AM  Result Value Ref Range   Alcohol, Ethyl (B) <5 <5 mg/dL    Comment:        LOWEST DETECTABLE LIMIT FOR SERUM ALCOHOL IS 5 mg/dL FOR MEDICAL PURPOSES ONLY   Salicylate level     Status: None   Collection Time: 08/27/16  3:26 AM  Result Value Ref Range   Salicylate Lvl <1.6 2.8 - 30.0 mg/dL  Acetaminophen level     Status: Abnormal   Collection Time: 08/27/16  3:26 AM  Result Value Ref Range   Acetaminophen (Tylenol), Serum <10 (L) 10 - 30 ug/mL    Comment:        THERAPEUTIC CONCENTRATIONS VARY SIGNIFICANTLY. A RANGE OF 10-30 ug/mL MAY BE AN EFFECTIVE CONCENTRATION FOR MANY PATIENTS. HOWEVER, SOME ARE BEST TREATED AT CONCENTRATIONS OUTSIDE THIS RANGE. ACETAMINOPHEN CONCENTRATIONS >150 ug/mL AT 4 HOURS AFTER INGESTION AND >50 ug/mL AT 12 HOURS  AFTER INGESTION ARE OFTEN ASSOCIATED WITH TOXIC REACTIONS.   cbc     Status: Abnormal   Collection Time: 08/27/16  3:26 AM  Result Value Ref Range   WBC 9.7 3.8 - 10.6 K/uL   RBC 4.73 4.40 - 5.90 MIL/uL   Hemoglobin 13.9 13.0 - 18.0 g/dL   HCT 41.0 40.0 - 52.0 %   MCV 86.8 80.0 - 100.0 fL   MCH 29.4 26.0 - 34.0 pg   MCHC 33.8 32.0 - 36.0 g/dL   RDW 13.8 11.5 -  14.5 %   Platelets 126 (L) 150 - 440 K/uL  Urine Drug Screen, Qualitative     Status: Abnormal   Collection Time: 08/27/16 12:57 PM  Result Value Ref Range   Tricyclic, Ur Screen NONE DETECTED NONE DETECTED   Amphetamines, Ur Screen NONE DETECTED NONE DETECTED   MDMA (Ecstasy)Ur Screen NONE DETECTED NONE DETECTED   Cocaine Metabolite,Ur [place] NONE DETECTED NONE DETECTED   Opiate, Ur Screen NONE DETECTED NONE DETECTED   Phencyclidine (PCP) Ur S NONE DETECTED NONE DETECTED   Cannabinoid 50 Ng, Ur  POSITIVE (A) NONE DETECTED   Barbiturates, Ur Screen NONE DETECTED NONE DETECTED   Benzodiazepine, Ur Scrn POSITIVE (A) NONE DETECTED   Methadone Scn, Ur NONE DETECTED NONE DETECTED    Comment: (NOTE) 270  Tricyclics, urine               Cutoff 1000 ng/mL 200  Amphetamines, urine             Cutoff 1000 ng/mL 300  MDMA (Ecstasy), urine           Cutoff 500 ng/mL 400  Cocaine Metabolite, urine       Cutoff 300 ng/mL 500  Opiate, urine                   Cutoff 300 ng/mL 600  Phencyclidine (PCP), urine      Cutoff 25 ng/mL 700  Cannabinoid, urine              Cutoff 50 ng/mL 800  Barbiturates, urine             Cutoff 200 ng/mL 900  Benzodiazepine, urine           Cutoff 200 ng/mL 1000 Methadone, urine                Cutoff 300 ng/mL 1100 1200 The urine drug screen provides only a preliminary, unconfirmed 1300 analytical test result and should not be used for non-medical 1400 purposes. Clinical consideration and professional judgment should 1500 be applied to any positive drug screen result due to possible 1600 interfering  substances. A more specific alternate chemical method 1700 must be used in order to obtain a confirmed analytical result.  1800 Gas chromato graphy / mass spectrometry (GC/MS) is the preferred 1900 confirmatory method.     No current facility-administered medications for this encounter.    Current Outpatient Prescriptions  Medication Sig Dispense Refill  . levETIRAcetam (KEPPRA) 500 MG tablet Take 1 tablet (500 mg total) by mouth 2 (two) times daily. 60 tablet 0  . naproxen (NAPROSYN) 500 MG tablet Take 1 tablet (500 mg total) by mouth 2 (two) times daily with a meal. (Patient not taking: Reported on 08/27/2016) 20 tablet 2    Musculoskeletal: Strength & Muscle Tone: within normal limits Gait & Station: normal Patient leans: N/A  Psychiatric Specialty Exam: Physical Exam  Nursing note and vitals reviewed. Constitutional: He appears well-developed and well-nourished.  HENT:  Head: Normocephalic and atraumatic.  Eyes: Conjunctivae are normal. Pupils are equal, round, and reactive to light.  Neck: Normal range of motion.  Cardiovascular: Regular rhythm and normal heart sounds.   Respiratory: Effort normal. No respiratory distress.  GI: Soft.  Musculoskeletal: Normal range of motion.  Neurological: He is alert.  Skin: Skin is warm and dry.  Psychiatric: He has a normal mood and affect. His speech is normal and behavior is normal. Judgment and thought content normal. Cognition and memory are normal.  Review of Systems  Constitutional: Negative.   HENT: Negative.   Eyes: Negative.   Respiratory: Negative.   Cardiovascular: Negative.   Gastrointestinal: Negative.   Musculoskeletal: Negative.   Skin: Negative.   Neurological: Negative.   Psychiatric/Behavioral: Positive for substance abuse. Negative for depression, hallucinations, memory loss and suicidal ideas. The patient is not nervous/anxious and does not have insomnia.     Blood pressure 124/82, pulse 78, temperature  98.6 F (37 C), temperature source Oral, resp. rate 20, weight 72.6 kg (160 lb), SpO2 98 %.Body mass index is 21.11 kg/m.  General Appearance: Casual  Eye Contact:  Good  Speech:  Clear and Coherent  Volume:  Normal  Mood:  Anxious  Affect:  Appropriate  Thought Process:  Goal Directed  Orientation:  Full (Time, Place, and Person)  Thought Content:  Logical  Suicidal Thoughts:  No  Homicidal Thoughts:  No  Memory:  Immediate;   Good Recent;   Good Remote;   Fair  Judgement:  Fair  Insight:  Fair  Psychomotor Activity:  Decreased  Concentration:  Concentration: Good  Recall:  Good  Fund of Knowledge:  Good  Language:  Good  Akathisia:  No  Handed:  Right  AIMS (if indicated):     Assets:  Communication Skills Desire for Improvement Housing Physical Health Resilience Social Support  ADL's:  Intact  Cognition:  WNL  Sleep:        Treatment Plan Summary: Plan 26 year old man currently presents as a very appropriate in his affect and behavior. No sign of cognitive impairment. No sign of emotional instability. Denies any suicidal thoughts and convincingly reports many positive things in his life. Patient was educated about the way that benzodiazepine and alcohol abuse can lead to seizures and it was pointed out to him that this would be a very good reason for him to not abuse benzodiazepines or alcohol in the future. Patient expressed understanding of that. He will be given a referral to [facility]. He can be discharged however from the emergency room. Discontinue commitment. Case reviewed with the ER physician.  Disposition: Patient does not meet criteria for psychiatric inpatient admission. Supportive therapy provided about ongoing stressors.  Alethia Berthold, MD 08/27/2016 3:30 PM

## 2016-08-27 NOTE — ED Notes (Signed)
Pt is calm, cooperative and is consenting to safety at this time. Will continue to assess for aggressive behavior. Trained sitter has pt within sight and is q15 room/safety checks.

## 2016-12-30 ENCOUNTER — Encounter (HOSPITAL_COMMUNITY): Payer: Self-pay | Admitting: Nurse Practitioner

## 2016-12-30 ENCOUNTER — Emergency Department (HOSPITAL_COMMUNITY)
Admission: EM | Admit: 2016-12-30 | Discharge: 2016-12-30 | Disposition: A | Discharge status: Home / Self Care | Payer: Self-pay | Attending: Emergency Medicine | Admitting: Emergency Medicine

## 2016-12-30 DIAGNOSIS — Y999 Unspecified external cause status: Secondary | ICD-10-CM | POA: Insufficient documentation

## 2016-12-30 DIAGNOSIS — R569 Unspecified convulsions: Secondary | ICD-10-CM

## 2016-12-30 DIAGNOSIS — Y929 Unspecified place or not applicable: Secondary | ICD-10-CM | POA: Insufficient documentation

## 2016-12-30 DIAGNOSIS — S01511A Laceration without foreign body of lip, initial encounter: Secondary | ICD-10-CM

## 2016-12-30 DIAGNOSIS — Y9383 Activity, rough housing and horseplay: Secondary | ICD-10-CM | POA: Insufficient documentation

## 2016-12-30 DIAGNOSIS — Z79899 Other long term (current) drug therapy: Secondary | ICD-10-CM | POA: Insufficient documentation

## 2016-12-30 DIAGNOSIS — S0512XA Contusion of eyeball and orbital tissues, left eye, initial encounter: Secondary | ICD-10-CM

## 2016-12-30 LAB — I-STAT CHEM 8, ED
BUN: 20 mg/dL (ref 6–20)
CALCIUM ION: 1.22 mmol/L (ref 1.15–1.40)
Chloride: 101 mmol/L (ref 101–111)
Creatinine, Ser: 0.8 mg/dL (ref 0.61–1.24)
Glucose, Bld: 92 mg/dL (ref 65–99)
HEMATOCRIT: 47 % (ref 39.0–52.0)
HEMOGLOBIN: 16 g/dL (ref 13.0–17.0)
Potassium: 3.9 mmol/L (ref 3.5–5.1)
SODIUM: 140 mmol/L (ref 135–145)
TCO2: 26 mmol/L (ref 0–100)

## 2016-12-30 MED ORDER — LEVETIRACETAM 500 MG PO TABS: 500.0000 mg | ORAL_TABLET | Freq: Two times a day (BID) | ORAL | 0 refills | Status: DC

## 2016-12-30 MED ORDER — LIDOCAINE HCL (PF) 1 % IJ SOLN
5.0000 mL | Freq: Once | INTRAMUSCULAR | Status: AC
  Administered 2016-12-30: 5 mL
  Filled 2016-12-30: qty 30

## 2016-12-30 MED ORDER — SODIUM CHLORIDE 0.9 % IV SOLN
1000.0000 mg | Freq: Once | INTRAVENOUS | Status: AC
  Administered 2016-12-30: 1000 mg via INTRAVENOUS
  Filled 2016-12-30: qty 10
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
## 2017-03-07 ENCOUNTER — Emergency Department
Admission: EM | Admit: 2017-03-07 | Discharge: 2017-03-07 | Disposition: A | Discharge status: Home / Self Care | Payer: Self-pay | Attending: Emergency Medicine | Admitting: Emergency Medicine

## 2017-03-07 ENCOUNTER — Encounter: Payer: Self-pay | Admitting: Emergency Medicine

## 2017-03-07 ENCOUNTER — Emergency Department: Payer: Self-pay

## 2017-03-07 DIAGNOSIS — Y998 Other external cause status: Secondary | ICD-10-CM | POA: Insufficient documentation

## 2017-03-07 DIAGNOSIS — X58XXXA Exposure to other specified factors, initial encounter: Secondary | ICD-10-CM | POA: Insufficient documentation

## 2017-03-07 DIAGNOSIS — R569 Unspecified convulsions: Secondary | ICD-10-CM

## 2017-03-07 DIAGNOSIS — Y92410 Unspecified street and highway as the place of occurrence of the external cause: Secondary | ICD-10-CM | POA: Insufficient documentation

## 2017-03-07 DIAGNOSIS — Z79899 Other long term (current) drug therapy: Secondary | ICD-10-CM | POA: Insufficient documentation

## 2017-03-07 DIAGNOSIS — S0081XA Abrasion of other part of head, initial encounter: Secondary | ICD-10-CM | POA: Insufficient documentation

## 2017-03-07 DIAGNOSIS — Z9114 Patient's other noncompliance with medication regimen: Secondary | ICD-10-CM | POA: Insufficient documentation

## 2017-03-07 DIAGNOSIS — Y9301 Activity, walking, marching and hiking: Secondary | ICD-10-CM | POA: Insufficient documentation

## 2017-03-07 DIAGNOSIS — T148XXA Other injury of unspecified body region, initial encounter: Secondary | ICD-10-CM

## 2017-03-07 LAB — CBC WITH DIFFERENTIAL/PLATELET
BASOS ABS: 0 10*3/uL (ref 0–0.1)
Basophils Relative: 0 %
Eosinophils Absolute: 0.2 10*3/uL (ref 0–0.7)
Eosinophils Relative: 2 %
HCT: 39.5 % — ABNORMAL LOW (ref 40.0–52.0)
Hemoglobin: 13.6 g/dL (ref 13.0–18.0)
Lymphocytes Relative: 5 %
Lymphs Abs: 0.5 10*3/uL — ABNORMAL LOW (ref 1.0–3.6)
MCH: 29.7 pg (ref 26.0–34.0)
MCHC: 34.5 g/dL (ref 32.0–36.0)
MCV: 86 fL (ref 80.0–100.0)
MONO ABS: 0.7 10*3/uL (ref 0.2–1.0)
Monocytes Relative: 7 %
NEUTROS ABS: 8.3 10*3/uL — AB (ref 1.4–6.5)
Neutrophils Relative %: 86 %
Platelets: 98 10*3/uL — ABNORMAL LOW (ref 150–440)
RBC: 4.59 MIL/uL (ref 4.40–5.90)
RDW: 13.4 % (ref 11.5–14.5)
WBC: 9.7 10*3/uL (ref 3.8–10.6)

## 2017-03-07 LAB — BASIC METABOLIC PANEL
ANION GAP: 6 (ref 5–15)
BUN: 17 mg/dL (ref 6–20)
CALCIUM: 9.2 mg/dL (ref 8.9–10.3)
CHLORIDE: 107 mmol/L (ref 101–111)
CO2: 27 mmol/L (ref 22–32)
Creatinine, Ser: 0.81 mg/dL (ref 0.61–1.24)
GFR calc Af Amer: >60 mL/min (ref >60)
GFR calc non Af Amer: >60 mL/min (ref >60)
GLUCOSE: 107 mg/dL — AB (ref 65–99)
POTASSIUM: 4.6 mmol/L (ref 3.5–5.1)
Sodium: 140 mmol/L (ref 135–145)

## 2017-03-07 MED ORDER — LEVETIRACETAM 500 MG PO TABS: 500.0000 mg | ORAL_TABLET | Freq: Two times a day (BID) | ORAL | 0 refills | Status: DC

## 2017-03-07 MED ORDER — LEVETIRACETAM 500 MG/5ML IV SOLN
1000.0000 mg | Freq: Once | INTRAVENOUS | Status: AC
  Administered 2017-03-07: 1000 mg via INTRAVENOUS
  Filled 2017-03-07: qty 10

## 2017-03-07 MED ORDER — IBUPROFEN 600 MG PO TABS
600.0000 mg | ORAL_TABLET | Freq: Once | ORAL | Status: AC
  Administered 2017-03-07: 600 mg via ORAL
  Filled 2017-03-07: qty 1

## 2017-03-07 NOTE — Discharge Instructions (Signed)
As we discussed it is very important that you do not drive, go up on roofs, swim, or put yourself in any situation that might be dangerous for you or others if you were to have another seizure until you are cleared by a neurologist. Please seek medical attention for any high fevers, chest pain, shortness of breath, change in behavior, persistent vomiting, bloody stool or any other new or concerning symptoms. ° °

## 2017-03-07 NOTE — ED Triage Notes (Signed)
Pt was walking, had seizure and fell onto his face. Abrasions and swelling to face with pt stating he has loose teeth. Abrasion to rt knuckle. No other c/o. Noncompliant with his keppra x 4 months.

## 2017-03-07 NOTE — ED Provider Notes (Signed)
[facility] Emergency Department Provider Note   ____________________________________________   I have reviewed the triage vital signs and the nursing notes.   HISTORY  Chief Complaint Seizure  History limited by: Not Limited   HPI Herbert Griffin is a 26 y.o. male who presents to the emergency department today after a seizure. Patient has a history of seizure disorder and is admittedly non compliant with his keppra. The patient was apparently walking down the street when he had his seizure. This happened today. He fell and hit his face. He is primarily complaining of facial pain at this point. Feels like his top teeth are out of place. He denies any recent illness. Denies any headache or neck pain.    Past Medical History:  Diagnosis Date  . Seizures [facility])     Patient Active Problem List   Diagnosis Date Noted  . Substance induced mood disorder (HCC) 08/27/2016  . Benzodiazepine abuse 08/27/2016    No past surgical history on file.  Prior to Admission medications   Medication Sig Start Date End Date Taking? Authorizing Provider  levETIRAcetam (KEPPRA) 500 MG tablet Take 1 tablet (500 mg total) by mouth 2 (two) times daily. 08/13/16   Palumbo, April, MD  levETIRAcetam (KEPPRA) 500 MG tablet Take 1 tablet (500 mg total) by mouth 2 (two) times daily. 12/30/16   Alvira Monday, MD  naproxen (NAPROSYN) 500 MG tablet Take 1 tablet (500 mg total) by mouth 2 (two) times daily with a meal. Patient not taking: Reported on 08/27/2016 01/18/16   Jene Every, MD    Allergies Patient has no known allergies.  No family history on file.  Social History Social History  Substance Use Topics  . Smoking status: Never Smoker  . Smokeless tobacco: Never Used  . Alcohol use Yes     Comment: Occassionally    Review of Systems Constitutional: No fever/chills Eyes: No visual changes. ENT: Feels like upper teeth are out of place.  Cardiovascular: Denies chest  pain. Respiratory: Denies shortness of breath. Gastrointestinal: No abdominal pain.  No nausea, no vomiting.  No diarrhea.   Genitourinary: Negative for dysuria. Musculoskeletal: Negative for back pain. Skin: Positive for abrasion to the upper lip and face.  Neurological: Positive for seizure.   ____________________________________________   PHYSICAL EXAM:  VITAL SIGNS: ED Triage Vitals  Enc Vitals Group     BP 139/75     Pulse 67     Resp 16     Temp 98     Temp src      SpO2 100    Constitutional: Alert and oriented. Well appearing and in no distress. Eyes: Conjunctivae are normal.  ENT   Head: Normocephalic   Nose: No septal hematoma.    Mouth/Throat: Upper lip with abrasions and swelling. .   Neck: No stridor. Hematological/Lymphatic/Immunilogical: No cervical lymphadenopathy. Cardiovascular: Normal rate, regular rhythm.  No murmurs, rubs, or gallops.  Respiratory: Normal respiratory effort without tachypnea nor retractions. Breath sounds are clear and equal bilaterally. No wheezes/rales/rhonchi. Gastrointestinal: Soft and non tender. No rebound. No guarding.  Genitourinary: Deferred Musculoskeletal: Normal range of motion in all extremities. No lower extremity edema. Neurologic:  Normal speech and language. No gross focal neurologic deficits are appreciated.  Skin:  Skin is warm, dry and intact. No rash noted. Psychiatric: Mood and affect are normal. Speech and behavior are normal. Patient exhibits appropriate insight and judgment.  ____________________________________________    LABS (pertinent positives/negatives)  BMP wnl except Glu 107  CBC WBC: 9.7 Hgb: 13.6 Plt: 98   ____________________________________________   EKG  None  ____________________________________________    RADIOLOGY  CT max face No facial  fracture  ____________________________________________   PROCEDURES  Procedures  ____________________________________________   INITIAL IMPRESSION / ASSESSMENT AND PLAN / ED COURSE  Pertinent labs & imaging results that were available during my care of the patient were reviewed by me and considered in my medical decision making (see chart for details).  Patient presents to the emergency department today after a seizure. Patient states that he is not taking his seizure medications. He did have some abrasions to his face and complained of some loose teeth so CT max face was obtained. This did not show any concerning jaw fracture. No facial fractures were identified. Patient was given IV Keppra here. Patient will be given neurology follow-up information. I discussed seizure precautions including no driving.   ____________________________________________   FINAL CLINICAL IMPRESSION(S) / ED DIAGNOSES  Final diagnoses:  Seizure (HCC)  Abrasion     Note: This dictation was prepared with Dragon dictation. Any transcriptional errors that result from this process are unintentional     Phineas Semen, MD 03/07/17 1450

## 2017-03-07 NOTE — ED Notes (Signed)
Pt also given ice pack

## 2017-04-11 DIAGNOSIS — R456 Violent behavior: Secondary | ICD-10-CM | POA: Insufficient documentation

## 2017-04-11 DIAGNOSIS — F4320 Adjustment disorder, unspecified: Secondary | ICD-10-CM | POA: Insufficient documentation

## 2017-04-11 DIAGNOSIS — Z008 Encounter for other general examination: Secondary | ICD-10-CM | POA: Insufficient documentation

## 2017-04-11 DIAGNOSIS — Z79899 Other long term (current) drug therapy: Secondary | ICD-10-CM | POA: Insufficient documentation

## 2017-04-12 ENCOUNTER — Emergency Department
Admission: EM | Admit: 2017-04-12 | Discharge: 2017-04-12 | Disposition: A | Discharge status: Home / Self Care | Payer: Self-pay | Attending: Emergency Medicine | Admitting: Emergency Medicine

## 2017-04-12 DIAGNOSIS — R4689 Other symptoms and signs involving appearance and behavior: Secondary | ICD-10-CM

## 2017-04-12 DIAGNOSIS — F4320 Adjustment disorder, unspecified: Secondary | ICD-10-CM

## 2017-04-12 LAB — COMPREHENSIVE METABOLIC PANEL
ALBUMIN: 4.4 g/dL (ref 3.5–5.0)
ALK PHOS: 74 U/L (ref 38–126)
ALT: 23 U/L (ref 17–63)
AST: 19 U/L (ref 15–41)
Anion gap: 7 (ref 5–15)
BILIRUBIN TOTAL: 0.8 mg/dL (ref 0.3–1.2)
BUN: 20 mg/dL (ref 6–20)
CALCIUM: 9.4 mg/dL (ref 8.9–10.3)
CO2: 27 mmol/L (ref 22–32)
CREATININE: 0.84 mg/dL (ref 0.61–1.24)
Chloride: 104 mmol/L (ref 101–111)
GFR calc Af Amer: >60 mL/min (ref >60)
GFR calc non Af Amer: >60 mL/min (ref >60)
GLUCOSE: 99 mg/dL (ref 65–99)
Potassium: 3.5 mmol/L (ref 3.5–5.1)
SODIUM: 138 mmol/L (ref 135–145)
TOTAL PROTEIN: 7.5 g/dL (ref 6.5–8.1)

## 2017-04-12 LAB — CBC
HEMATOCRIT: 44.5 % (ref 40.0–52.0)
HEMOGLOBIN: 15 g/dL (ref 13.0–18.0)
MCH: 29 pg (ref 26.0–34.0)
MCHC: 33.6 g/dL (ref 32.0–36.0)
MCV: 86.2 fL (ref 80.0–100.0)
Platelets: 102 10*3/uL — ABNORMAL LOW (ref 150–440)
RBC: 5.16 MIL/uL (ref 4.40–5.90)
RDW: 13.4 % (ref 11.5–14.5)
WBC: 5.6 10*3/uL (ref 3.8–10.6)

## 2017-04-12 LAB — ACETAMINOPHEN LEVEL: Acetaminophen (Tylenol), Serum: <10 ug/mL — ABNORMAL LOW (ref 10–30)

## 2017-04-12 LAB — SALICYLATE LEVEL: Salicylate Lvl: <7 mg/dL (ref 2.8–30.0)

## 2017-04-12 LAB — ETHANOL: Alcohol, Ethyl (B): <10 mg/dL (ref <10)

## 2017-04-12 NOTE — ED Notes (Signed)
[facility]  -  Machine set up in room   pt sitting up in bed eating breakfast  - NAD assessed  Plan of care discussed  - he verbalized understanding   Consult to begin shortly

## 2017-04-12 NOTE — Discharge Instructions (Signed)
Please follow-up with your regular doctor. You can also follow-up with [facility] the contact information is on the other sheet of paper that we gave you. Please return here for any further problems.

## 2017-04-12 NOTE — ED Notes (Signed)

## 2017-04-12 NOTE — ED Notes (Signed)
Pt dressed out. Pt belongings bag: no cash, cellphone, pair of shoes, pair of socks, briefs, shorts, pants, 2 earrings, shirt, hairband. Belongings bag handed off to [facility] NT.

## 2017-04-12 NOTE — ED Notes (Signed)
Patient observed lying in bed with eyes closed  Even, unlabored respirations observed   NAD pt appears to be sleeping  I will continue to monitor along with every 15 minute visual observations and ongoing security monitoring    

## 2017-04-12 NOTE — ED Notes (Signed)
ED Is the patient under IVC or is there intent for IVC:  voluntary Is the patient medically cleared: Yes.   Is there vacancy in the ED BHU: Yes.   Is the population mix appropriate for patient: Yes.   Is the patient awaiting placement in inpatient or outpatient setting: Yes.   Has the patient had a psychiatric consult: SOC consult to be performed Survey of unit performed for contraband, proper placement and condition of furniture, tampering with fixtures in bathroom, shower, and each patient room: Yes.  ; Findings:  APPEARANCE/BEHAVIOR Calm and cooperative NEURO ASSESSMENT Orientation: oriented x3  Denies pain Hallucinations: No.None noted (Hallucinations) Speech: Normal Gait: normal RESPIRATORY ASSESSMENT Even  Unlabored respirations  CARDIOVASCULAR ASSESSMENT Pulses equal   regular rate  Skin warm and dry   GASTROINTESTINAL ASSESSMENT no GI complaint EXTREMITIES Full ROM  PLAN OF CARE Provide calm/safe environment. Vital signs assessed twice daily. ED BHU Assessment once each 12-hour shift. Collaborate with TTS when available or as condition indicates. Assure the ED provider has rounded once each shift. Provide and encourage hygiene. Provide redirection as needed. Assess for escalating behavior; address immediately and inform ED provider.  Assess family dynamic and appropriateness for visitation as needed: Yes.  ; If necessary, describe findings:  Educate the patient/family about BHU procedures/visitation: Yes.  ; If necessary, describe findings:

## 2017-04-12 NOTE — ED Triage Notes (Signed)
Patient here voluntary with [place] [employer], reports his mother thought he was going to hurt himself.  Patient denies SI, reports thoughts of hurting someone else 2 hours ago but not any more.

## 2017-04-12 NOTE — ED Provider Notes (Signed)
[facility] Emergency Department Provider Note   ____________________________________________   First MD Initiated Contact with Patient 04/12/17 [phone]     (approximate)  I have reviewed the triage vital signs and the nursing notes.   HISTORY  Chief Complaint Psychiatric Evaluation    HPI GRAIG HESSLING is a 26 y.o. male Who comes into the hospital today after he reports that he became upset and said some stuff that affected his mom. He reports that since he respects her he decided to come in and get checked out. The patient reports that he got angry and wanted to fight people. He denies making any threats to kill anyone and he denies any suicidal ideation. The patient reports he has not been drinking or doing any drugs. The patient has not heard any voices. He states that he was hospitalized in July because his mom thought he would do something to himself. The patient states that he was at [facility] at the time. The patient is here today for evaluation.   Past Medical History:  Diagnosis Date  . Seizures [facility])     Patient Active Problem List   Diagnosis Date Noted  . Substance induced mood disorder (HCC) 08/27/2016  . Benzodiazepine abuse (HCC) 08/27/2016    No past surgical history on file.  Prior to Admission medications   Medication Sig Start Date End Date Taking? Authorizing Provider  levETIRAcetam (KEPPRA) 500 MG tablet Take 1 tablet (500 mg total) by mouth 2 (two) times daily. Patient not taking: Reported on 03/07/2017 08/13/16   Palumbo, April, MD  levETIRAcetam (KEPPRA) 500 MG tablet Take 1 tablet (500 mg total) by mouth 2 (two) times daily. 03/07/17   Phineas Semen, MD  naproxen (NAPROSYN) 500 MG tablet Take 1 tablet (500 mg total) by mouth 2 (two) times daily with a meal. Patient not taking: Reported on 08/27/2016 01/18/16   Jene Every, MD    Allergies Patient has no known allergies.  No family history on file.  Social  History Social History  Substance Use Topics  . Smoking status: Never Smoker  . Smokeless tobacco: Never Used  . Alcohol use Yes     Comment: Occassionally    Review of Systems  Constitutional: No fever/chills Eyes: No visual changes. ENT: No sore throat. Cardiovascular: Denies chest pain. Respiratory: Denies shortness of breath. Gastrointestinal: No abdominal pain.  No nausea, no vomiting.  No diarrhea.  No constipation. Genitourinary: Negative for dysuria. Musculoskeletal: Negative for back pain. Skin: Negative for rash. Neurological: Negative for headaches, focal weakness or numbness.   ____________________________________________   PHYSICAL EXAM:  VITAL SIGNS: ED Triage Vitals [04/12/17 0003]  Enc Vitals Group     BP 125/77     Pulse Rate 68     Resp 18     Temp (!) 97.4 F (36.3 C)     Temp Source Oral     SpO2 100 %     Weight 160 lb (72.6 kg)     Height 6' (1.829 m)     Head Circumference      Peak Flow      Pain Score      Pain Loc      Pain Edu?      Excl. in GC?     Constitutional: Alert and oriented. Well appearing and in no acute distress. Eyes: Conjunctivae are normal. PERRL. EOMI. Head: Atraumatic. Nose: No congestion/rhinnorhea. Mouth/Throat: Mucous membranes are moist.  Oropharynx non-erythematous. Cardiovascular: Normal rate, regular rhythm.  Grossly normal heart sounds.  Good peripheral circulation. Respiratory: Normal respiratory effort.  No retractions. Lungs CTAB. Gastrointestinal: Soft and nontender. No distention. positive bowel sounds Musculoskeletal: No lower extremity tenderness nor edema.   Neurologic:  Normal speech and language.  Skin:  Skin is warm, dry and intact.  Psychiatric: Mood and affect are normal.   ____________________________________________   LABS (all labs ordered are listed, but only abnormal results are displayed)  Labs Reviewed  ACETAMINOPHEN LEVEL - Abnormal; Notable for the following:       Result Value    Acetaminophen (Tylenol), Serum <10 (*)    All other components within normal limits  CBC - Abnormal; Notable for the following:    Platelets 102 (*)    All other components within normal limits  COMPREHENSIVE METABOLIC PANEL  ETHANOL  SALICYLATE LEVEL  URINE DRUG SCREEN, QUALITATIVE ([facility] ONLY)   ____________________________________________  EKG  none ____________________________________________  RADIOLOGY  No results found.  ____________________________________________   PROCEDURES  Procedure(s) performed: None  Procedures  Critical Care performed: No  ____________________________________________   INITIAL IMPRESSION / ASSESSMENT AND PLAN / ED COURSE  As part of my medical decision making, I reviewed the following data within the electronic MEDICAL RECORD NUMBER Notes from prior ED visits and [place] Controlled Substance Database   This is a 26 year old male who comes into the hospital today after having a verbal altercation and threatening to fight with someone. The patient denies any suicidal or homicidal ideation. The patient will be evaluated by specialist on-call.  At this time we are still awaiting the specialist on-call evaluation of the patient. His care will be signed out to the on coming physician      ____________________________________________   FINAL CLINICAL IMPRESSION(S) / ED DIAGNOSES  Final diagnoses:  Aggressive behavior      NEW MEDICATIONS STARTED DURING THIS VISIT:  New Prescriptions   No medications on file     Note:  This document was prepared using Dragon voice recognition software and may include unintentional dictation errors.    Rebecka ApleyWebster, Allison P, MD 04/12/17 [phone]

## 2017-04-12 NOTE — ED Provider Notes (Signed)
patient seen by acidosis see psychiatrywith adjustment disorder patient is felt to be stable and dischargeable. We will discharge him follow-up with his regular doctor   Herbert NatalMalinda, Paul F, MD 04/12/17 [phone]

## 2017-04-13 ENCOUNTER — Ambulatory Visit: Payer: Self-pay | Admitting: Pharmacy Technician

## 2017-04-13 ENCOUNTER — Encounter (INDEPENDENT_AMBULATORY_CARE_PROVIDER_SITE_OTHER): Payer: Self-pay

## 2017-04-13 DIAGNOSIS — Z79899 Other long term (current) drug therapy: Secondary | ICD-10-CM

## 2017-04-13 NOTE — Progress Notes (Signed)
Met with patient completed financial assistance application for [employer] due to recent hospital visit.  Patient agreed to be responsible for gathering financial information and forwarding to appropriate department in [place].    Completed [facility] application and contract.  Patient agreed to all terms of the [facility] contract.  Patient approved to receive medication assistance through 2018, as long as eligibility criteria continues to be met.  Provided patient with community resource material based on his particular needs.    Zasha Belleau J. Kassy Mcenroe Care Manager [facility]  

## 2017-08-01 ENCOUNTER — Emergency Department
Admission: EM | Admit: 2017-08-01 | Discharge: 2017-08-01 | Disposition: A | Discharge status: Home / Self Care | Payer: Self-pay | Attending: Emergency Medicine | Admitting: Emergency Medicine

## 2017-08-01 ENCOUNTER — Encounter: Payer: Self-pay | Admitting: Emergency Medicine

## 2017-08-01 DIAGNOSIS — F12129 Cannabis abuse with intoxication, unspecified: Secondary | ICD-10-CM | POA: Insufficient documentation

## 2017-08-01 DIAGNOSIS — G40909 Epilepsy, unspecified, not intractable, without status epilepticus: Secondary | ICD-10-CM | POA: Insufficient documentation

## 2017-08-01 DIAGNOSIS — Z9119 Patient's noncompliance with other medical treatment and regimen: Secondary | ICD-10-CM | POA: Insufficient documentation

## 2017-08-01 DIAGNOSIS — Z79899 Other long term (current) drug therapy: Secondary | ICD-10-CM | POA: Insufficient documentation

## 2017-08-01 DIAGNOSIS — F12929 Cannabis use, unspecified with intoxication, unspecified: Secondary | ICD-10-CM

## 2017-08-01 DIAGNOSIS — F12188 Cannabis abuse with other cannabis-induced disorder: Secondary | ICD-10-CM | POA: Insufficient documentation

## 2017-08-01 DIAGNOSIS — R569 Unspecified convulsions: Secondary | ICD-10-CM

## 2017-08-01 DIAGNOSIS — F1210 Cannabis abuse, uncomplicated: Secondary | ICD-10-CM

## 2017-08-01 LAB — COMPREHENSIVE METABOLIC PANEL
ALT: 18 U/L (ref 17–63)
AST: 26 U/L (ref 15–41)
Albumin: 3.3 g/dL — ABNORMAL LOW (ref 3.5–5.0)
Alkaline Phosphatase: 53 U/L (ref 38–126)
Anion gap: 6 (ref 5–15)
BUN: 16 mg/dL (ref 6–20)
CHLORIDE: 114 mmol/L — AB (ref 101–111)
CO2: 22 mmol/L (ref 22–32)
Calcium: 7.2 mg/dL — ABNORMAL LOW (ref 8.9–10.3)
Creatinine, Ser: 0.69 mg/dL (ref 0.61–1.24)
GFR calc Af Amer: >60 mL/min (ref >60)
GFR calc non Af Amer: >60 mL/min (ref >60)
GLUCOSE: 90 mg/dL (ref 65–99)
POTASSIUM: 3 mmol/L — AB (ref 3.5–5.1)
Sodium: 142 mmol/L (ref 135–145)
Total Bilirubin: 0.6 mg/dL (ref 0.3–1.2)
Total Protein: 5.7 g/dL — ABNORMAL LOW (ref 6.5–8.1)

## 2017-08-01 LAB — CBC WITH DIFFERENTIAL/PLATELET
BASOS ABS: 0.1 10*3/uL (ref 0–0.1)
Basophils Relative: 1 %
EOS PCT: 4 %
Eosinophils Absolute: 0.4 10*3/uL (ref 0–0.7)
HCT: 44.8 % (ref 40.0–52.0)
Hemoglobin: 15 g/dL (ref 13.0–18.0)
Lymphocytes Relative: 24 %
Lymphs Abs: 2.1 10*3/uL (ref 1.0–3.6)
MCH: 29.4 pg (ref 26.0–34.0)
MCHC: 33.4 g/dL (ref 32.0–36.0)
MCV: 88 fL (ref 80.0–100.0)
MONO ABS: 0.7 10*3/uL (ref 0.2–1.0)
Monocytes Relative: 8 %
NEUTROS ABS: 5.3 10*3/uL (ref 1.4–6.5)
Neutrophils Relative %: 63 %
PLATELETS: 141 10*3/uL — AB (ref 150–440)
RBC: 5.09 MIL/uL (ref 4.40–5.90)
RDW: 14.3 % (ref 11.5–14.5)
WBC: 8.5 10*3/uL (ref 3.8–10.6)

## 2017-08-01 MED ORDER — LEVETIRACETAM 500 MG PO TABS
500.0000 mg | ORAL_TABLET | Freq: Once | ORAL | Status: AC
  Administered 2017-08-01: 500 mg via ORAL
  Filled 2017-08-01: qty 1

## 2017-08-01 NOTE — ED Provider Notes (Signed)
[facility] Emergency Department Provider Note  ____________________________________________   First MD Initiated Contact with Patient 08/01/17 2022     (approximate)  I have reviewed the triage vital signs and the nursing notes.   HISTORY  Chief Complaint Seizures   HPI Herbert Griffin is a 27 y.o. male who was brought to the emergency department by EMS after having a generalized tonic-clonic seizure.  The patient has a long-standing history of seizure disorder and he is noncompliant with his Keppra.  He does not take his Keppra because he smokes marijuana every day and he does not feel that it is safe to mix these 2 drugs.  His seizure today came on suddenly.  It lasted 30 seconds to a minute.  He did have roughly a 30-minute postictal period.  He was not incontinent to urine.  He now has a mild severity diffuse throbbing nonradiating headache somewhat worse with movement somewhat improved with rest.  Past Medical History:  Diagnosis Date  . Seizures [facility](HCC)     Patient Active Problem List   Diagnosis Date Noted  . Substance induced mood disorder (HCC) 08/27/2016  . Benzodiazepine abuse (HCC) 08/27/2016    History reviewed. No pertinent surgical history.  Prior to Admission medications   Medication Sig Start Date End Date Taking? Authorizing Provider  levETIRAcetam (KEPPRA) 500 MG tablet Take 1 tablet (500 mg total) by mouth 2 (two) times daily. Patient not taking: Reported on 03/07/2017 08/13/16   Palumbo, April, MD  levETIRAcetam (KEPPRA) 500 MG tablet Take 1 tablet (500 mg total) by mouth 2 (two) times daily. 03/07/17   Phineas SemenGoodman, Graydon, MD  naproxen (NAPROSYN) 500 MG tablet Take 1 tablet (500 mg total) by mouth 2 (two) times daily with a meal. Patient not taking: Reported on 08/27/2016 01/18/16   Jene EveryKinner, Robert, MD    Allergies Patient has no known allergies.  No family history on file.  Social History Social History   Tobacco Use  . Smoking  status: Never Smoker  . Smokeless tobacco: Never Used  Substance Use Topics  . Alcohol use: Yes    Comment: Occassionally  . Drug use: Yes    Types: Marijuana    Comment: Last use yesterday    Review of Systems Constitutional: No fever/chills Eyes: No visual changes. ENT: No sore throat. Cardiovascular: Denies chest pain. Respiratory: Denies shortness of breath. Gastrointestinal: No abdominal pain.  No nausea, no vomiting.  No diarrhea.  No constipation. Genitourinary: Negative for dysuria. Musculoskeletal: Negative for back pain. Skin: Negative for rash. Neurological: Positive for headache   ____________________________________________   PHYSICAL EXAM:  VITAL SIGNS: ED Triage Vitals  Enc Vitals Group     BP      Pulse      Resp      Temp      Temp src      SpO2      Weight      Height      Head Circumference      Peak Flow      Pain Score      Pain Loc      Pain Edu?      Excl. in GC?     Constitutional: Alert and oriented x4 appropriate cooperative speaks in full clear sentences no diaphoresis Heavy smell of cannabis Eyes: PERRL EOMI. Head: Atraumatic. Nose: No congestion/rhinnorhea. Mouth/Throat: No trismus bites to lateral aspect of tongue Neck: No stridor.   Cardiovascular: Normal rate, regular rhythm. Grossly normal  heart sounds.  Good peripheral circulation. Respiratory: Normal respiratory effort.  No retractions. Lungs CTAB and moving good air Gastrointestinal: Soft nontender Musculoskeletal: No lower extremity edema   Neurologic:  Normal speech and language. No gross focal neurologic deficits are appreciated. Skin:  Skin is warm, dry and intact. No rash noted. Psychiatric: Mood and affect are normal. Speech and behavior are normal.    ____________________________________________   DIFFERENTIAL includes but not limited to  Medication noncompliance, seizure, dehydration, metabolic derangement, drug  addiction ____________________________________________   LABS (all labs ordered are listed, but only abnormal results are displayed)  Labs Reviewed  CBC WITH DIFFERENTIAL/PLATELET - Abnormal; Notable for the following components:      Result Value   Platelets 141 (*)    All other components within normal limits  COMPREHENSIVE METABOLIC PANEL - Abnormal; Notable for the following components:   Potassium 3.0 (*)    Chloride 114 (*)    Calcium 7.2 (*)    Total Protein 5.7 (*)    Albumin 3.3 (*)    All other components within normal limits    Lab work reviewed by me with no acute disease __________________________________________  EKG   ____________________________________________  RADIOLOGY   ____________________________________________   PROCEDURES  Procedure(s) performed: no  Procedures  Critical Care performed: no  Observation: no ____________________________________________   INITIAL IMPRESSION / ASSESSMENT AND PLAN / ED COURSE  Pertinent labs & imaging results that were available during my care of the patient were reviewed by me and considered in my medical decision making (see chart for details).  The patient arrives hemodynamically stable and neurologically intact.  He smells heavily of marijuana and does state that he smokes nearly every day.  He has been noncompliant with his Keppra as he does not like to mix Keppra with marijuana.    Fortunately the patient's blood work is reassuring.  Given him a first dose of Keppra now and we will give him a one-month supply.  Discharged home in improved condition verbalizes understanding and agreement with the plan.  ____________________________________________   FINAL CLINICAL IMPRESSION(S) / ED DIAGNOSES  Final diagnoses:  Seizure (HCC)  Marijuana abuse  Cannabis intoxication with complication (HCC)      NEW MEDICATIONS STARTED DURING THIS VISIT:  Discharge Medication List as of 08/01/2017 10:09 PM        Note:  This document was prepared using Dragon voice recognition software and may include unintentional dictation errors.     Merrily Brittle, MD 08/04/17 [phone]

## 2017-08-01 NOTE — Discharge Instructions (Signed)
Please resume taking your Keppra and follow-up with your primary care physician within 1 week for reevaluation.  Return to the emergency department for any concerns.  It was a pleasure to take care of you today, and thank you for coming to our emergency department.  If you have any questions or concerns before leaving please ask the nurse to grab me and I'm more than happy to go through your aftercare instructions again.  If you were prescribed any opioid pain medication today such as Norco, Vicodin, Percocet, morphine, hydrocodone, or oxycodone please make sure you do not drive when you are taking this medication as it can alter your ability to drive safely.  If you have any concerns once you are home that you are not improving or are in fact getting worse before you can make it to your follow-up appointment, please do not hesitate to call 911 and come back for further evaluation.  Merrily BrittleNeil Damieon Armendariz, MD  Results for orders placed or performed during the hospital encounter of 08/01/17  CBC with Differential  Result Value Ref Range   WBC 8.5 3.8 - 10.6 K/uL   RBC 5.09 4.40 - 5.90 MIL/uL   Hemoglobin 15.0 13.0 - 18.0 g/dL   HCT 16.144.8 09.640.0 - 04.552.0 %   MCV 88.0 80.0 - 100.0 fL   MCH 29.4 26.0 - 34.0 pg   MCHC 33.4 32.0 - 36.0 g/dL   RDW 40.914.3 81.111.5 - 91.414.5 %   Platelets 141 (L) 150 - 440 K/uL   Neutrophils Relative % 63 %   Neutro Abs 5.3 1.4 - 6.5 K/uL   Lymphocytes Relative 24 %   Lymphs Abs 2.1 1.0 - 3.6 K/uL   Monocytes Relative 8 %   Monocytes Absolute 0.7 0.2 - 1.0 K/uL   Eosinophils Relative 4 %   Eosinophils Absolute 0.4 0 - 0.7 K/uL   Basophils Relative 1 %   Basophils Absolute 0.1 0 - 0.1 K/uL  Comprehensive metabolic panel  Result Value Ref Range   Sodium 142 135 - 145 mmol/L   Potassium 3.0 (L) 3.5 - 5.1 mmol/L   Chloride 114 (H) 101 - 111 mmol/L   CO2 22 22 - 32 mmol/L   Glucose, Bld 90 65 - 99 mg/dL   BUN 16 6 - 20 mg/dL   Creatinine, Ser 7.820.69 0.61 - 1.24 mg/dL   Calcium  7.2 (L) 8.9 - 10.3 mg/dL   Total Protein 5.7 (L) 6.5 - 8.1 g/dL   Albumin 3.3 (L) 3.5 - 5.0 g/dL   AST 26 15 - 41 U/L   ALT 18 17 - 63 U/L   Alkaline Phosphatase 53 38 - 126 U/L   Total Bilirubin 0.6 0.3 - 1.2 mg/dL   GFR calc non Af Amer >60 >60 mL/min   GFR calc Af Amer >60 >60 mL/min   Anion gap 6 5 - 15

## 2017-08-01 NOTE — ED Triage Notes (Signed)
Patient arrives via EMS with family stating patient had GM seizure.  EMS reports pt having focal seizure and they have 2mg  Versed en route.  Pt is AOx4, talking and laughing with staff.

## 2017-08-01 NOTE — ED Notes (Signed)
Green top redrawn and sent to lab, they stated the first draw hemolyzed.

## 2017-09-23 ENCOUNTER — Telehealth: Payer: Self-pay | Admitting: Pharmacy Technician

## 2017-09-23 NOTE — Telephone Encounter (Signed)
Patient failed to provide 2019 poi.  No additional medication assistance will be provided by [facility] without the required proof of income documentation.  Patient notified by letter.  Rayman Petrosian J. Stevie Charter Care Manager [facility] 

## 2017-10-01 ENCOUNTER — Emergency Department
Admission: EM | Admit: 2017-10-01 | Discharge: 2017-10-01 | Disposition: A | Discharge status: Home / Self Care | Payer: Self-pay | Attending: Emergency Medicine | Admitting: Emergency Medicine

## 2017-10-01 ENCOUNTER — Emergency Department: Payer: Self-pay

## 2017-10-01 ENCOUNTER — Encounter: Payer: Self-pay | Admitting: Emergency Medicine

## 2017-10-01 ENCOUNTER — Other Ambulatory Visit: Payer: Self-pay

## 2017-10-01 DIAGNOSIS — Y929 Unspecified place or not applicable: Secondary | ICD-10-CM | POA: Insufficient documentation

## 2017-10-01 DIAGNOSIS — R569 Unspecified convulsions: Secondary | ICD-10-CM | POA: Insufficient documentation

## 2017-10-01 DIAGNOSIS — W19XXXA Unspecified fall, initial encounter: Secondary | ICD-10-CM

## 2017-10-01 DIAGNOSIS — S0121XA Laceration without foreign body of nose, initial encounter: Secondary | ICD-10-CM | POA: Insufficient documentation

## 2017-10-01 DIAGNOSIS — Y9389 Activity, other specified: Secondary | ICD-10-CM | POA: Insufficient documentation

## 2017-10-01 DIAGNOSIS — W0110XA Fall on same level from slipping, tripping and stumbling with subsequent striking against unspecified object, initial encounter: Secondary | ICD-10-CM | POA: Insufficient documentation

## 2017-10-01 DIAGNOSIS — R51 Headache: Secondary | ICD-10-CM | POA: Insufficient documentation

## 2017-10-01 DIAGNOSIS — Y998 Other external cause status: Secondary | ICD-10-CM | POA: Insufficient documentation

## 2017-10-01 DIAGNOSIS — F1210 Cannabis abuse, uncomplicated: Secondary | ICD-10-CM | POA: Insufficient documentation

## 2017-10-01 DIAGNOSIS — Z23 Encounter for immunization: Secondary | ICD-10-CM | POA: Insufficient documentation

## 2017-10-01 LAB — CBC WITH DIFFERENTIAL/PLATELET
BASOS ABS: 0 10*3/uL (ref 0–0.1)
Basophils Relative: 1 %
EOS PCT: 2 %
Eosinophils Absolute: 0.1 10*3/uL (ref 0–0.7)
HCT: 42.4 % (ref 40.0–52.0)
Hemoglobin: 14.7 g/dL (ref 13.0–18.0)
LYMPHS ABS: 0.9 10*3/uL — AB (ref 1.0–3.6)
LYMPHS PCT: 14 %
MCH: 29.8 pg (ref 26.0–34.0)
MCHC: 34.6 g/dL (ref 32.0–36.0)
MCV: 85.9 fL (ref 80.0–100.0)
Monocytes Absolute: 0.6 10*3/uL (ref 0.2–1.0)
Monocytes Relative: 9 %
NEUTROS ABS: 4.8 10*3/uL (ref 1.4–6.5)
Neutrophils Relative %: 74 %
PLATELETS: 132 10*3/uL — AB (ref 150–440)
RBC: 4.94 MIL/uL (ref 4.40–5.90)
RDW: 13.7 % (ref 11.5–14.5)
WBC: 6.4 10*3/uL (ref 3.8–10.6)

## 2017-10-01 LAB — COMPREHENSIVE METABOLIC PANEL
ALBUMIN: 4.7 g/dL (ref 3.5–5.0)
ALT: 21 U/L (ref 17–63)
ANION GAP: 7 (ref 5–15)
AST: 27 U/L (ref 15–41)
Alkaline Phosphatase: 80 U/L (ref 38–126)
BUN: 20 mg/dL (ref 6–20)
CHLORIDE: 104 mmol/L (ref 101–111)
CO2: 28 mmol/L (ref 22–32)
Calcium: 9.4 mg/dL (ref 8.9–10.3)
Creatinine, Ser: 0.94 mg/dL (ref 0.61–1.24)
GFR calc Af Amer: >60 mL/min (ref >60)
GFR calc non Af Amer: >60 mL/min (ref >60)
GLUCOSE: 86 mg/dL (ref 65–99)
POTASSIUM: 4 mmol/L (ref 3.5–5.1)
SODIUM: 139 mmol/L (ref 135–145)
TOTAL PROTEIN: 7.8 g/dL (ref 6.5–8.1)
Total Bilirubin: 1.1 mg/dL (ref 0.3–1.2)

## 2017-10-01 LAB — TROPONIN I: Troponin I: <0.03 ng/mL (ref <0.03)

## 2017-10-01 MED ORDER — TETANUS-DIPHTH-ACELL PERTUSSIS 5-2.5-18.5 LF-MCG/0.5 IM SUSP
0.5000 mL | Freq: Once | INTRAMUSCULAR | Status: AC
  Administered 2017-10-01: 0.5 mL via INTRAMUSCULAR
  Filled 2017-10-01: qty 0.5

## 2017-10-01 MED ORDER — SODIUM CHLORIDE 0.9 % IV SOLN
1000.0000 mg | Freq: Once | INTRAVENOUS | Status: AC
  Administered 2017-10-01: 1000 mg via INTRAVENOUS
  Filled 2017-10-01: qty 10

## 2017-10-01 NOTE — Discharge Instructions (Addendum)
Please continue your Keppra.  Please follow-up with your treating physician.  Please return for any further problems including any signs of infection on the cut on your nose or the cut on your head.  This would include increased pain redness or swelling.

## 2017-10-01 NOTE — ED Triage Notes (Addendum)
Patient states "I feel like everything happened in a flash".  Patient states he was at the holiday [place] and patient fell on face.  Witnessed by a stranger.  Swelling to left head and small lac to bridge of nose.  Patient has a history of Seizures and takes Keppra.  States he takes one 500 mg tablet a day.

## 2017-10-01 NOTE — ED Notes (Signed)
Patient transported to CT 

## 2017-10-01 NOTE — ED Triage Notes (Signed)
Patient is AAOx3.  slightly agitated/ anxious.  Knows what year it is, but unable to recall the month.

## 2017-10-01 NOTE — ED Provider Notes (Signed)
[facility] Emergency Department Provider Note   ____________________________________________   First MD Initiated Contact with Patient 10/01/17 1620     (approximate)  I have reviewed the triage vital signs and the nursing notes.   HISTORY  Chief Complaint Fall   HPI [facility] Griffin is a 27 y.o. male patient has a history of seizures.  He takes Keppra but did not take it today.  Review of old records shows that he uses marijuana as well and does not want to mix the 2 medicines.  Patient says he has a seizure today fell on his face he has a lump on the side of his head about the size of half a golf ball with an abrasion on it and a small cut on his nose he has a headache at present but is otherwise awake alert oriented and moving all of his extremities equally and well.   Past Medical History:  Diagnosis Date  . Seizures [facility](HCC)     Patient Active Problem List   Diagnosis Date Noted  . Substance induced mood disorder (HCC) 08/27/2016  . Benzodiazepine abuse (HCC) 08/27/2016    History reviewed. No pertinent surgical history.  Prior to Admission medications   Medication Sig Start Date End Date Taking? Authorizing Provider  levETIRAcetam (KEPPRA) 500 MG tablet Take 1 tablet (500 mg total) by mouth 2 (two) times daily. Patient not taking: Reported on 03/07/2017 08/13/16   Palumbo, April, MD  levETIRAcetam (KEPPRA) 500 MG tablet Take 1 tablet (500 mg total) by mouth 2 (two) times daily. 03/07/17   Phineas SemenGoodman, Graydon, MD  naproxen (NAPROSYN) 500 MG tablet Take 1 tablet (500 mg total) by mouth 2 (two) times daily with a meal. Patient not taking: Reported on 08/27/2016 01/18/16   Jene EveryKinner, Robert, MD    Allergies Patient has no known allergies.  No family history on file.  Social History Social History   Tobacco Use  . Smoking status: Never Smoker  . Smokeless tobacco: Never Used  Substance Use Topics  . Alcohol use: Yes    Comment: Occassionally  . Drug  use: Yes    Types: Marijuana    Comment: Last use yesterday    Review of Systems  Constitutional: No fever/chills Eyes: No visual changes. ENT: No sore throat. Cardiovascular: Denies chest pain. Respiratory: Denies shortness of breath. Gastrointestinal: No abdominal pain.  No nausea, no vomiting.  No diarrhea.  No constipation. Genitourinary: Negative for dysuria. Musculoskeletal: Negative for back pain. Skin: Negative for rash. Neurological: Negative for headaches, focal weakness   ____________________________________________   PHYSICAL EXAM:  VITAL SIGNS: ED Triage Vitals  Enc Vitals Group     BP 10/01/17 1611 116/66     Pulse Rate 10/01/17 1611 (!) 58     Resp 10/01/17 1611 16     Temp 10/01/17 1611 (!) 97.5 F (36.4 C)     Temp Source 10/01/17 1611 Oral     SpO2 10/01/17 1611 98 %     Weight 10/01/17 1608 163 lb (73.9 kg)     Height 10/01/17 1608 6' (1.829 m)     Head Circumference --      Peak Flow --      Pain Score 10/01/17 1608 0     Pain Loc --      Pain Edu? --      Excl. in GC? --     Constitutional: Alert and oriented. Well appearing and in no acute distress. Eyes: Conjunctivae are normal. PER.  EOMI. Head: Atraumatic. Nose: No congestion/rhinnorhea. No nasal septal hemat  Mouth/Throat: Mucous membranes are moist.  Oropharynx non-erythematous. oma Neck: No stridor.  Cardiovascular: Normal rate, regular rhythm. Peri Jefferson peripheral circulation. Respiratory: Normal respiratory effort.  No retractions.  Gastrointestinal: Soft and nontender. No distention. No abdominal bruits. No CVA tenderness. Musculoskeletal: No lower extremity tenderness nor edema.  Neurologic:  Normal speech and language. No gross focal neurologic deficits are appreciated.  Skin:  Skin is warm, dry and intact. No rash noted. Psychiatric: Mood and affect are normal. Speech and behavior are normal.  ____________________________________________   LABS (all labs ordered are listed,  but only abnormal results are displayed)  Labs Reviewed  CBC WITH DIFFERENTIAL/PLATELET - Abnormal; Notable for the following components:      Result Value   Platelets 132 (*)    Lymphs Abs 0.9 (*)    All other components within normal limits  COMPREHENSIVE METABOLIC PANEL  TROPONIN I  URINE DRUG SCREEN, QUALITATIVE ([facility] ONLY)  URINALYSIS, COMPLETE (UACMP) WITH MICROSCOPIC  LEVETIRACETAM LEVEL   ____________________________________________  EKG  EKG read and interpret by me shows normal sinus rhythm rate of 75 normal axis he has diffuse ST elevation which in view of his lack of chest pain is likely to be early repolarization. _Review of previous EKG shows he has had ST elevation previously ___________________________________________  RADIOLOGY  ED MD interpretation: CT of the head and neck and chest x-ray all showed no acute disease chest x-ray  Official radiology report(s): Ct Head Wo Contrast  Result Date: 10/01/2017 CLINICAL DATA:  Fall.  History of seizures.  Initial encounter. EXAM: CT HEAD WITHOUT CONTRAST CT CERVICAL SPINE WITHOUT CONTRAST TECHNIQUE: Multidetector CT imaging of the head and cervical spine was performed following the standard protocol without intravenous contrast. Multiplanar CT image reconstructions of the cervical spine were also generated. COMPARISON:  Maxillofacial CT 03/07/2017. Head and cervical spine CT 08/13/2016. FINDINGS: CT HEAD FINDINGS Brain: There is no evidence of acute infarct, intracranial hemorrhage, mass, midline shift, or extra-axial fluid collection. The ventricles and sulci are normal. Vascular: No hyperdense vessel. Skull: No fracture or focal osseous lesion. Sinuses/Orbits: Mild left sphenoid sinus mucosal thickening. Clear mastoid air cells. Unremarkable visualized orbits. Other: Small left-sided scalp hematoma. CT CERVICAL SPINE FINDINGS Alignment: Cervical spine straightening.  No listhesis. Skull base and vertebrae: No fracture or focal  osseous lesion. Soft tissues and spinal canal: No prevertebral fluid or swelling. No visible canal hematoma. Disc levels:  Unremarkable. Upper chest: Clear lung apices. Other: None. IMPRESSION: 1. No evidence of acute intracranial abnormality. 2. Small left-sided scalp hematoma. 3. No cervical spine fracture or subluxation. Electronically Signed   By: Sebastian Ache M.D.   On: 10/01/2017 16:51   Ct Cervical Spine Wo Contrast  Result Date: 10/01/2017 CLINICAL DATA:  Fall.  History of seizures.  Initial encounter. EXAM: CT HEAD WITHOUT CONTRAST CT CERVICAL SPINE WITHOUT CONTRAST TECHNIQUE: Multidetector CT imaging of the head and cervical spine was performed following the standard protocol without intravenous contrast. Multiplanar CT image reconstructions of the cervical spine were also generated. COMPARISON:  Maxillofacial CT 03/07/2017. Head and cervical spine CT 08/13/2016. FINDINGS: CT HEAD FINDINGS Brain: There is no evidence of acute infarct, intracranial hemorrhage, mass, midline shift, or extra-axial fluid collection. The ventricles and sulci are normal. Vascular: No hyperdense vessel. Skull: No fracture or focal osseous lesion. Sinuses/Orbits: Mild left sphenoid sinus mucosal thickening. Clear mastoid air cells. Unremarkable visualized orbits. Other: Small left-sided scalp hematoma. CT CERVICAL SPINE  FINDINGS Alignment: Cervical spine straightening.  No listhesis. Skull base and vertebrae: No fracture or focal osseous lesion. Soft tissues and spinal canal: No prevertebral fluid or swelling. No visible canal hematoma. Disc levels:  Unremarkable. Upper chest: Clear lung apices. Other: None. IMPRESSION: 1. No evidence of acute intracranial abnormality. 2. Small left-sided scalp hematoma. 3. No cervical spine fracture or subluxation. Electronically Signed   By: Sebastian Ache M.D.   On: 10/01/2017 16:51   Dg Chest Portable 1 View  Result Date: 10/01/2017 CLINICAL DATA:  Seizure. EXAM: PORTABLE CHEST 1 VIEW  COMPARISON:  None. FINDINGS: The heart size and mediastinal contours are within normal limits. Both lungs are clear. The visualized skeletal structures are unremarkable. IMPRESSION: Normal chest x-ray. Electronically Signed   By: Obie Dredge M.D.   On: 10/01/2017 16:46    ____________________________________________   PROCEDURES  Procedure(s) performed:   Procedures  Critical Care performed:   ____________________________________________   INITIAL IMPRESSION / ASSESSMENT AND PLAN / ED COURSE           ____________________________________________   FINAL CLINICAL IMPRESSION(S) / ED DIAGNOSES  Final diagnoses:  Fall, initial encounter  Seizure (HCC)  Laceration of nose, initial encounter     ED Discharge Orders    None       Note:  This document was prepared using Dragon voice recognition software and may include unintentional dictation errors.    Arnaldo Natal, MD 10/01/17 [phone]

## 2017-10-05 LAB — LEVETIRACETAM LEVEL: LEVETIRACETAM: NOT DETECTED ug/mL (ref 10.0–40.0)

## 2017-11-05 ENCOUNTER — Encounter: Payer: Self-pay | Admitting: Emergency Medicine

## 2017-11-05 ENCOUNTER — Emergency Department
Admission: EM | Admit: 2017-11-05 | Discharge: 2017-11-05 | Disposition: A | Discharge status: Home / Self Care | Payer: Self-pay | Attending: Emergency Medicine | Admitting: Emergency Medicine

## 2017-11-05 ENCOUNTER — Other Ambulatory Visit: Payer: Self-pay

## 2017-11-05 DIAGNOSIS — R112 Nausea with vomiting, unspecified: Secondary | ICD-10-CM | POA: Insufficient documentation

## 2017-11-05 DIAGNOSIS — Z79899 Other long term (current) drug therapy: Secondary | ICD-10-CM | POA: Insufficient documentation

## 2017-11-05 DIAGNOSIS — D72819 Decreased white blood cell count, unspecified: Secondary | ICD-10-CM | POA: Insufficient documentation

## 2017-11-05 LAB — COMPREHENSIVE METABOLIC PANEL
ALBUMIN: 3.8 g/dL (ref 3.5–5.0)
ALT: 19 U/L (ref 17–63)
AST: 23 U/L (ref 15–41)
Alkaline Phosphatase: 67 U/L (ref 38–126)
Anion gap: 5 (ref 5–15)
BUN: 19 mg/dL (ref 6–20)
CALCIUM: 8.5 mg/dL — AB (ref 8.9–10.3)
CHLORIDE: 104 mmol/L (ref 101–111)
CO2: 29 mmol/L (ref 22–32)
CREATININE: 0.89 mg/dL (ref 0.61–1.24)
GFR calc Af Amer: >60 mL/min (ref >60)
GFR calc non Af Amer: >60 mL/min (ref >60)
GLUCOSE: 93 mg/dL (ref 65–99)
POTASSIUM: 4.2 mmol/L (ref 3.5–5.1)
Sodium: 138 mmol/L (ref 135–145)
Total Bilirubin: 0.4 mg/dL (ref 0.3–1.2)
Total Protein: 6.9 g/dL (ref 6.5–8.1)

## 2017-11-05 LAB — CBC
HCT: 41.5 % (ref 40.0–52.0)
Hemoglobin: 14.1 g/dL (ref 13.0–18.0)
MCH: 28.8 pg (ref 26.0–34.0)
MCHC: 33.9 g/dL (ref 32.0–36.0)
MCV: 84.9 fL (ref 80.0–100.0)
PLATELETS: 81 10*3/uL — AB (ref 150–440)
RBC: 4.88 MIL/uL (ref 4.40–5.90)
RDW: 14.1 % (ref 11.5–14.5)
WBC: 3.1 10*3/uL — AB (ref 3.8–10.6)

## 2017-11-05 LAB — LIPASE, BLOOD: Lipase: 39 U/L (ref 11–51)

## 2017-11-05 MED ORDER — LEVETIRACETAM 500 MG PO TABS
500.0000 mg | ORAL_TABLET | ORAL | Status: AC
  Administered 2017-11-05: 500 mg via ORAL
  Filled 2017-11-05: qty 1

## 2017-11-05 MED ORDER — ONDANSETRON 4 MG PO TBDP: 4.0000 mg | ORAL_TABLET | Freq: Four times a day (QID) | ORAL | 0 refills | Status: DC | PRN

## 2017-11-05 NOTE — ED Triage Notes (Signed)
First Nurse Note:  Arrives c/o N/V/D x 2 days.  Patient is AAOx3.  Skin warm and dry.  Ambulates with and easy and steady gait.  Posture upright and relaxed.  NAD

## 2017-11-05 NOTE — ED Notes (Signed)
ED Provider at bedside. 

## 2017-11-05 NOTE — Discharge Instructions (Signed)
It is important you follow-up with [facility] or a primary care doctor as soon as possible. I'd recommend you have an HIV test and additional evaluation including a complete blood count for further evaluation of your low white blood count and platelet count within 1-2 weeks.  Please return to the emergency room right away if you are to develop a fever, severe nausea, your pain becomes severe or worsens, you are unable to keep food down, begin vomiting any dark or bloody fluid, you develop any dark or bloody stools, feel dehydrated, or other new concerns or symptoms arise.

## 2017-11-05 NOTE — ED Triage Notes (Signed)
Pt to ED via POV c/o emesis, pt states that he is not able to keep anything down. Pt has hx/o seizures and thinks that he may have had one in his sleeping because his muscles feel tight. Pt is in NAD at this time.

## 2017-11-05 NOTE — ED Provider Notes (Signed)
[name] Emergency Department Provider Note   ____________________________________________   First MD Initiated Contact with Patient 11/05/17 1843     (approximate)  I have reviewed the triage vital signs and the nursing notes.   HISTORY  Chief Complaint Diarrhea    HPI Herbert Griffin is a 27 y.o. male reports that he had a court date on Wednesday.  While there he experienced a crampy feeling anxiety, delay on the ground in the bathroom.  Reports he was very concerned about his court date, reports he was anxiety that got best of him.  Then on Thursday play basketball for a good portion of the day, when he got home he felt very tired he took a nap and felt when he woke up that he was crampy across his shoulders and arms.  He reports he has a history of seizures, but in discussion further with him and his mom they all seem to resolve around times he stopped taking Xanax abruptly.  Reports his last seizure was 2 months ago and occurred while he was going through withdrawals from Xanax use at the [facility].  He is not used or abused any drugs except for marijuana which he smokes occasionally for 2 months time now.  He did not have a witnessed seizure this time and did not wake up with a headache or any confusion  Reports he also ate some chicken salad that did not sit well on his stomach and he threw up, and felt loose stools on Thursday.  He says he is been feeling quite a bit better now is able to eat and drink and his appetite is better.  He does not have any abdominal pain.  Reports he told his mom about the symptoms today and she brought him to the ER to have him evaluated, though he reports he feels much better  Denies unprotected sex.  Denies illicit drug use by any IV route, previous history of Xanax abuse but reports he is no longer using them and has not used anything like that for about 2 months.  Past Medical History:  Diagnosis Date  . Seizures  [facility])     Patient Active Problem List   Diagnosis Date Noted  . Substance induced mood disorder (HCC) 08/27/2016  . Benzodiazepine abuse (HCC) 08/27/2016    History reviewed. No pertinent surgical history.  Prior to Admission medications   Medication Sig Start Date End Date Taking? Authorizing Provider  levETIRAcetam (KEPPRA) 500 MG tablet Take 1 tablet (500 mg total) by mouth 2 (two) times daily. Patient not taking: Reported on 03/07/2017 08/13/16   Palumbo, April, MD  levETIRAcetam (KEPPRA) 500 MG tablet Take 1 tablet (500 mg total) by mouth 2 (two) times daily. 03/07/17   Phineas Semen, MD  naproxen (NAPROSYN) 500 MG tablet Take 1 tablet (500 mg total) by mouth 2 (two) times daily with a meal. Patient not taking: Reported on 08/27/2016 01/18/16   Jene Every, MD    Allergies Patient has no known allergies.  No family history on file.  Social History Social History   Tobacco Use  . Smoking status: Never Smoker  . Smokeless tobacco: Never Used  Substance Use Topics  . Alcohol use: Yes    Comment: Occassionally  . Drug use: Yes    Types: Marijuana    Comment: Last use yesterday    Review of Systems Constitutional: No fever/chills Eyes: No visual changes. ENT: No sore throat. Cardiovascular: Denies chest pain. Respiratory:  Denies shortness of breath. Gastrointestinal: No abdominal pain for some abdominal cramps occurring on Thursday and Wednesday.  No constipation. Genitourinary: Negative for dysuria. Musculoskeletal: Negative for back pain. Skin: Negative for rash. Neurological: Negative for headaches, focal weakness or numbness.    ____________________________________________   PHYSICAL EXAM:  VITAL SIGNS: ED Triage Vitals  Enc Vitals Group     BP 11/05/17 1657 115/60     Pulse Rate 11/05/17 1657 81     Resp 11/05/17 1657 16     Temp 11/05/17 1657 99.2 F (37.3 C)     Temp Source 11/05/17 1657 Oral     SpO2 11/05/17 1657 99 %     Weight 11/05/17  1658 163 lb (73.9 kg)     Height --      Head Circumference --      Peak Flow --      Pain Score 11/05/17 1658 0     Pain Loc --      Pain Edu? --      Excl. in GC? --     Constitutional: Alert and oriented. Well appearing and in no acute distress.  Is very pleasant.  His mother is here with him.  Both very pleasant. Eyes: Conjunctivae are normal. Head: Atraumatic. Nose: No congestion/rhinnorhea. Mouth/Throat: Mucous membranes are moist. Neck: No stridor.   Cardiovascular: Normal rate, regular rhythm. Grossly normal heart sounds.  Good peripheral circulation. Respiratory: Normal respiratory effort.  No retractions. Lungs CTAB. Gastrointestinal: Soft and nontender. No distention.  No pain in the right lower quadrant.  No pain to McBurney's point.  No rebound guarding or tenderness in any quadrant. Musculoskeletal: No lower extremity tenderness nor edema. Neurologic:  Normal speech and language. No gross focal neurologic deficits are appreciated.  Skin:  Skin is warm, dry and intact. No rash noted. Psychiatric: Mood and affect are normal. Speech and behavior are normal.  ____________________________________________   LABS (all labs ordered are listed, but only abnormal results are displayed)  Labs Reviewed  COMPREHENSIVE METABOLIC PANEL - Abnormal; Notable for the following components:      Result Value   Calcium 8.5 (*)    All other components within normal limits  CBC - Abnormal; Notable for the following components:   WBC 3.1 (*)    Platelets 81 (*)    All other components within normal limits  LIPASE, BLOOD  URINALYSIS, COMPLETE (UACMP) WITH MICROSCOPIC  LEVETIRACETAM LEVEL   ____________________________________________  EKG   ____________________________________________  RADIOLOGY  No indication for abdominal imaging.  No abdominal tenderness.  No ongoing abdominal pain or complaints at this  time. ____________________________________________   PROCEDURES  Procedure(s) performed: None  Procedures  Critical Care performed: No  ____________________________________________   INITIAL IMPRESSION / ASSESSMENT AND PLAN / ED COURSE  Pertinent labs & imaging results that were available during my care of the patient were reviewed by me and considered in my medical decision making (see chart for details).  Patient resents for evaluation for episode of nausea vomiting diarrhea this seems to have occurred Wednesday and Thursday but is now improved.  Reports he came today as he reported some to his mother who brought him here.  He is very reassuring examination at this time.  No signs or symptoms of acute abdomen.  I do not believe that he describes as a seizure occurring either, he reports he takes Keppra but 500 mg only once a day because he does not like taking the pills because are so big.  Encouraged  him to take his appropriate dose, but he reports that he is "hard headed" and really is not interested in taking more than 1 pill a day.  Told him this raises his risk for seizure.  Has lab work his white count is also noted to be slightly low as well as platelets.  I discussed with him and recommended we test for "HIV AIDS" but the patient reports he does not want any testing done today.  He does not wish for any further testing, and discussion with him his mom though he does report he would be willing to set up a follow-up with a doctor through [facility] which his mother is familiar with.  I discussed with him careful return precautions, encouraged him to take his Keppra as prescribed.  I will prescribe him Zofran in the event he does have any ongoing nausea or vomiting but at this point he is drinking fluids, is drank a bottle of Gatorade here in the ER, appears well with very clinically reassuring examination.  Return precautions and treatment recommendations and follow-up discussed  with the patient who is agreeable with the plan.       ____________________________________________   FINAL CLINICAL IMPRESSION(S) / ED DIAGNOSES  Final diagnoses:  Non-intractable vomiting with nausea, unspecified vomiting type  Leukopenia, unspecified type      NEW MEDICATIONS STARTED DURING THIS VISIT:  New Prescriptions   No medications on file     Note:  This document was prepared using Dragon voice recognition software and may include unintentional dictation errors.     Sharyn Creamer, MD 11/05/17 Ebony Cargo

## 2017-11-09 LAB — LEVETIRACETAM LEVEL: Levetiracetam Lvl: 3 ug/mL — ABNORMAL LOW (ref 10.0–40.0)

## 2017-11-11 ENCOUNTER — Telehealth: Payer: Self-pay | Admitting: Emergency Medicine

## 2017-11-11 NOTE — Telephone Encounter (Signed)
Called patient to give levitricam level.  Left message asking him to call me.  Per chart review patient has not been taking his keppra as directed.

## 2017-11-16 ENCOUNTER — Inpatient Hospital Stay: Payer: Self-pay

## 2017-11-16 ENCOUNTER — Inpatient Hospital Stay: Payer: Self-pay | Attending: Hematology and Oncology | Admitting: Hematology and Oncology

## 2017-11-16 ENCOUNTER — Other Ambulatory Visit: Payer: Self-pay

## 2017-11-16 ENCOUNTER — Encounter: Payer: Self-pay | Admitting: Hematology and Oncology

## 2017-11-16 VITALS — BP 121/75 | HR 60 | Temp 96.8°F | Resp 18 | Ht 72.0 in | Wt 152.3 lb

## 2017-11-16 DIAGNOSIS — R05 Cough: Secondary | ICD-10-CM | POA: Insufficient documentation

## 2017-11-16 DIAGNOSIS — G40909 Epilepsy, unspecified, not intractable, without status epilepticus: Secondary | ICD-10-CM | POA: Insufficient documentation

## 2017-11-16 DIAGNOSIS — D72819 Decreased white blood cell count, unspecified: Secondary | ICD-10-CM

## 2017-11-16 DIAGNOSIS — R7989 Other specified abnormal findings of blood chemistry: Secondary | ICD-10-CM

## 2017-11-16 DIAGNOSIS — J3489 Other specified disorders of nose and nasal sinuses: Secondary | ICD-10-CM | POA: Insufficient documentation

## 2017-11-16 DIAGNOSIS — D696 Thrombocytopenia, unspecified: Secondary | ICD-10-CM | POA: Insufficient documentation

## 2017-11-16 DIAGNOSIS — F1290 Cannabis use, unspecified, uncomplicated: Secondary | ICD-10-CM | POA: Insufficient documentation

## 2017-11-16 DIAGNOSIS — Z79899 Other long term (current) drug therapy: Secondary | ICD-10-CM | POA: Insufficient documentation

## 2017-11-16 DIAGNOSIS — R634 Abnormal weight loss: Secondary | ICD-10-CM | POA: Insufficient documentation

## 2017-11-16 LAB — CBC WITH DIFFERENTIAL/PLATELET
Basophils Absolute: 0.1 10*3/uL (ref 0–0.1)
Basophils Relative: 1 %
EOS PCT: 3 %
Eosinophils Absolute: 0.2 10*3/uL (ref 0–0.7)
HEMATOCRIT: 44.1 % (ref 40.0–52.0)
Hemoglobin: 15.1 g/dL (ref 13.0–18.0)
LYMPHS ABS: 1.4 10*3/uL (ref 1.0–3.6)
LYMPHS PCT: 19 %
MCH: 29.2 pg (ref 26.0–34.0)
MCHC: 34.2 g/dL (ref 32.0–36.0)
MCV: 85.5 fL (ref 80.0–100.0)
MONO ABS: 0.5 10*3/uL (ref 0.2–1.0)
Monocytes Relative: 8 %
NEUTROS ABS: 4.9 10*3/uL (ref 1.4–6.5)
Neutrophils Relative %: 69 %
PLATELETS: 200 10*3/uL (ref 150–440)
RBC: 5.16 MIL/uL (ref 4.40–5.90)
RDW: 14.2 % (ref 11.5–14.5)
WBC: 7 10*3/uL (ref 3.8–10.6)

## 2017-11-16 LAB — TSH: TSH: <0.01 u[IU]/mL — ABNORMAL LOW (ref 0.350–4.500)

## 2017-11-16 LAB — T4, FREE: Free T4: 0.97 ng/dL (ref 0.82–1.77)

## 2017-11-16 LAB — COMPREHENSIVE METABOLIC PANEL
ALBUMIN: 4.6 g/dL (ref 3.5–5.0)
ALT: 71 U/L — ABNORMAL HIGH (ref 17–63)
ANION GAP: 10 (ref 5–15)
AST: 34 U/L (ref 15–41)
Alkaline Phosphatase: 73 U/L (ref 38–126)
BUN: 17 mg/dL (ref 6–20)
CHLORIDE: 104 mmol/L (ref 101–111)
CO2: 28 mmol/L (ref 22–32)
Calcium: 9.8 mg/dL (ref 8.9–10.3)
Creatinine, Ser: 0.86 mg/dL (ref 0.61–1.24)
GFR calc Af Amer: >60 mL/min (ref >60)
GFR calc non Af Amer: >60 mL/min (ref >60)
GLUCOSE: 60 mg/dL — AB (ref 65–99)
POTASSIUM: 3.6 mmol/L (ref 3.5–5.1)
SODIUM: 142 mmol/L (ref 135–145)
Total Bilirubin: 0.5 mg/dL (ref 0.3–1.2)
Total Protein: 8.3 g/dL — ABNORMAL HIGH (ref 6.5–8.1)

## 2017-11-16 LAB — FOLATE: FOLATE: 26 ng/mL (ref >5.9)

## 2017-11-16 LAB — PATHOLOGIST SMEAR REVIEW

## 2017-11-16 LAB — VITAMIN B12: Vitamin B-12: 460 pg/mL (ref 180–914)

## 2017-11-16 LAB — APTT: aPTT: 33 seconds (ref 24–36)

## 2017-11-16 LAB — PROTIME-INR
INR: 0.96
PROTHROMBIN TIME: 12.7 s (ref 11.4–15.2)

## 2017-11-16 NOTE — Progress Notes (Signed)
Patient here today as new evaluation/hospital followup regarding leukopenia and thrombocytopenia.  Accompanied by his mother and sister.

## 2017-11-16 NOTE — Progress Notes (Signed)
[facility] day:  11/16/2017  Chief Complaint: Herbert Griffin is a 27 y.o. male with leukopenia and thrombocytopenia who is referred in consultation by Dr. Delman Kitten for assessment and management.  HPI:  The patient was seen in the [facility] ER on 11/05/2017 with diarrhea.  He apparently had a court date on 11/03/2017.  He felt crampy, anxious, and had to lay on the ground in the bathroom.  He did play some basketball the following day.  When he awoke, he had cramping across his shoulders and arms.  He ate some chicken salad with subsequent emesis and diarrhea.  CBC on 11/05/2017 revealed a hematocrit of 41.5, hemoglobin 14.1, platelets 81,000, white count 3100.  CMP and lipase were normal.  He declined HIV testing.  Labs on 08/03/2017 included a hematocrit of 42.4, hemoglobin 14.7, platelets 132,000, white count 6400 with an ANC of 4800.  Differential was unremarkable with 74% segs, 14% lymphs, 9% monocytes, 2% eosinophils and 1% basophils.  Comprehensive metabolic panel was normal.  Creatinine was 0.94.  He has a past medical history of seizures dating back to 2016. Etiology is unknown. Mother notes that the patient has a history of "abusing Xanax", which she feels has been contributory. He denies current benzodiazepine use.  Patient denies preceding aura prior to his seizures. Patient was prescribed levetiracetam  500 mg BID. He is only taking the levetiracetam once daily citing that the pills are "too big". He states, "I have been doing alright, so I don't feel like I need to". Patient notes that his last seizure was precipitated by mixing his seizure medications and alcohol. Last seizure was 1.5 months ago. He has not been seen by neurology. Patient currently using marijuana.   Patient denies fever and sweats. He has recently lost 8 pounds (timeframe unknown). He describes his diet as "junk".  He eats pizza.  He will eat his "sister's fruit cups".  He has  had a non-productive cough x 2 months. Patient denies any new medications or herbal products.  Patient denies bleeding; no hematochezia, melena, or gross hematuria. He has not appreciated any areas of unexplained bruising or palpable adenopathy.   He denies pain in the clinic today.    Past Medical History:  Diagnosis Date  . Seizures ([place])     History reviewed. No pertinent surgical history.  Family History  Problem Relation Age of Onset  . Cancer Maternal Aunt     Social History:  reports that he has never smoked. He has never used smokeless tobacco. He reports that he drinks alcohol. He reports that he has current or past drug history. Drug: Marijuana. Has previously abused BZOs (Xanax) in the past.  Patient denies known exposures to radiation on toxins.  He lives with her aunt. He is unemployed. He likes to play basketball.  The patient is accompanied by his mother and sister today.  Allergies: No Known Allergies  Current Medications: Current Outpatient Medications  Medication Sig Dispense Refill  . levETIRAcetam (KEPPRA) 500 MG tablet Take 1 tablet (500 mg total) by mouth 2 (two) times daily. 60 tablet 0   No current facility-administered medications for this visit.     Review of Systems  Constitutional: Positive for weight loss (recent 8 pound weight loss). Negative for diaphoresis, fever and malaise/fatigue.       "I'm active. I play basketball."  HENT: Negative for nosebleeds and sore throat.        Rhinorrhea related  to seasonal allergies. Chronic dental infections.  Eyes: Negative.   Respiratory: Positive for cough (x 2 months). Negative for hemoptysis, sputum production and shortness of breath.   Cardiovascular: Negative for chest pain, palpitations, orthopnea, leg swelling and PND.  Gastrointestinal: Negative for abdominal pain, blood in stool, constipation, diarrhea, melena, nausea and vomiting.  Genitourinary: Negative for dysuria, frequency, hematuria and urgency.   Musculoskeletal: Negative for back pain, falls, joint pain and myalgias.  Skin: Negative for itching and rash.  Neurological: Positive for seizures (last 1.5 months ago). Negative for dizziness, tremors, weakness and headaches.  Endo/Heme/Allergies: Does not bruise/bleed easily.  Psychiatric/Behavioral: Positive for substance abuse (History of Xanax abuse. Current marijuana. ). Negative for depression, memory loss and suicidal ideas. The patient is nervous/anxious. The patient does not have insomnia.   All other systems reviewed and are negative.  Performance status (ECOG): 0 - Asymptomatic  Physical Exam: Blood pressure 121/75, pulse 60, temperature (!) 96.8 F (36 C), temperature source Tympanic, resp. rate 18, height 6' (1.829 m), weight 152 lb 5 oz (69.1 kg). GENERAL:  Thin young man sitting comfortably in the exam room in no acute distress. MENTAL STATUS:  Alert and oriented to person, place and time. HEAD:  Braided black hair pulled up with a rubber band on top of head.  Goatee.  Normocephalic, atraumatic, face symmetric, no Cushingoid features. EYES:  Brown eyes.  Pupils equal round and reactive to light and accomodation.  No conjunctivitis or scleral icterus. ENT:  Oropharynx clear without lesion.  Tongue normal. Mucous membranes moist.  RESPIRATORY:  Clear to auscultation without rales, wheezes or rhonchi. CARDIOVASCULAR:  Regular rate and rhythm without murmur, rub or gallop. ABDOMEN:  Soft, non-tender, with active bowel sounds, and no hepatosplenomegaly.  No masses. SKIN:  Tattoos.  No rashes, ulcers or lesions. EXTREMITIES: No edema, no skin discoloration or tenderness.  No palpable cords. LYMPH NODES: No palpable cervical, supraclavicular, axillary or inguinal adenopathy  NEUROLOGICAL: Unremarkable. PSYCH:  Appropriate.   No visits with results within 3 Day(s) from this visit.  Latest known visit with results is:  Admission on 11/05/2017, Discharged on 11/05/2017   Component Date Value Ref Range Status  . Lipase 11/05/2017 39  11 - 51 U/L Final   Performed at [facility], [address] [place], [place] [address]  . Sodium 11/05/2017 138  135 - 145 mmol/L Final  . Potassium 11/05/2017 4.2  3.5 - 5.1 mmol/L Final  . Chloride 11/05/2017 104  101 - 111 mmol/L Final  . CO2 11/05/2017 29  22 - 32 mmol/L Final  . Glucose, Bld 11/05/2017 93  65 - 99 mg/dL Final  . BUN 11/05/2017 19  6 - 20 mg/dL Final  . Creatinine, Ser 11/05/2017 0.89  0.61 - 1.24 mg/dL Final  . Calcium 11/05/2017 8.5* 8.9 - 10.3 mg/dL Final  . Total Protein 11/05/2017 6.9  6.5 - 8.1 g/dL Final  . Albumin 11/05/2017 3.8  3.5 - 5.0 g/dL Final  . AST 11/05/2017 23  15 - 41 U/L Final  . ALT 11/05/2017 19  17 - 63 U/L Final  . Alkaline Phosphatase 11/05/2017 67  38 - 126 U/L Final  . Total Bilirubin 11/05/2017 0.4  0.3 - 1.2 mg/dL Final  . GFR calc non Af Amer 11/05/2017 >60  >60 mL/min Final  . GFR calc Af Amer 11/05/2017 >60  >60 mL/min Final   Comment: (NOTE) The eGFR has been calculated using the CKD EPI equation. This calculation has not been  validated in all clinical situations. eGFR's persistently <60 mL/min signify possible Chronic Kidney Disease.   Georgiann Hahn gap 11/05/2017 5  5 - 15 Final   Performed at [facility], [address] [place], [place] [address]  . WBC 11/05/2017 3.1* 3.8 - 10.6 K/uL Final  . RBC 11/05/2017 4.88  4.40 - 5.90 MIL/uL Final  . Hemoglobin 11/05/2017 14.1  13.0 - 18.0 g/dL Final  . HCT 11/05/2017 41.5  40.0 - 52.0 % Final  . MCV 11/05/2017 84.9  80.0 - 100.0 fL Final  . MCH 11/05/2017 28.8  26.0 - 34.0 pg Final  . MCHC 11/05/2017 33.9  32.0 - 36.0 g/dL Final  . RDW 11/05/2017 14.1  11.5 - 14.5 % Final  . Platelets 11/05/2017 81* 150 - 440 K/uL Final   Performed at [facility], [address], [place], [place] [address]  . Levetiracetam Lvl 11/05/2017 3.0* 10.0 - 40.0 ug/mL Final   Comment: (NOTE) This test was  developed and its performance characteristics determined by [facility]. It has not been cleared or approved by the Food and Drug Administration. Performed At: [facility] [place], [place] [id number] Rush Farmer MD [id number] Performed at [facility], [address] [place], [place] [address]     Assessment:  Herbert Griffin is a 27 y.o. male with leukopenia and thrombocytopenia of unclear duration.  Etiology appears secondary to an acute illness.  He denies any exposure to radiation or toxins.  Diet is poor.    CBC on 11/05/2017 revealed a hematocrit of 41.5, hemoglobin 14.1, platelets 81,000, white count 3100.  He has a history of seizure disorder.  He is on Keppra.  Symptomatically, he feels "good".  He has a chronic cough.  Exam reveals no adenopathy or hepatosplenomegaly.  Plan: 1. Discuss non-specific leukopenia and thrombocytopenia. Review potential etiologies (vitamin deficiencies, acute infection, autoimmune).  2. Labs today:  CBC with diff, CMP, PT, PTT, ANA with reflex, hepatitis B core antibody total, hepatitis B surface antigen, hepatitis C antibody, HIV antibody, B12, folate, TSH. Patient provides verbal consent for hepatitis and HIV testing.  3. Peripheral smear for pathologic review. 4. CXR to evaluate chronic cough x 2 months.  5. Refer to neurology Melrose Nakayama) for evaluation of seizures.  6. RTC in 1 week for MD assessment, review of work up, and discussion regarding direction of therapy.    Honor Loh, NP  11/16/2017, 10:10 AM   I saw and evaluated the patient, participating in the key portions of the service and reviewing pertinent diagnostic studies and records.  I reviewed the nurse practitioner's note and agree with the findings and the plan.  The assessment and plan were discussed with the patient.  A few questions were asked by the patient and answered.   Nolon Stalls, MD 11/16/2017,10:10 AM

## 2017-11-17 LAB — HEPATITIS B SURFACE ANTIGEN: Hepatitis B Surface Ag: NEGATIVE

## 2017-11-17 LAB — HIV ANTIBODY (ROUTINE TESTING W REFLEX): HIV Screen 4th Generation wRfx: NONREACTIVE

## 2017-11-17 LAB — HEPATITIS B CORE ANTIBODY, TOTAL: HEP B C TOTAL AB: NEGATIVE

## 2017-11-17 LAB — ANA W/REFLEX: Anti Nuclear Antibody(ANA): NEGATIVE

## 2017-11-17 LAB — HEPATITIS C ANTIBODY

## 2017-11-23 ENCOUNTER — Inpatient Hospital Stay: Payer: Self-pay | Admitting: Hematology and Oncology

## 2017-11-23 DIAGNOSIS — R778 Other specified abnormalities of plasma proteins: Secondary | ICD-10-CM | POA: Insufficient documentation

## 2017-11-23 DIAGNOSIS — R7989 Other specified abnormal findings of blood chemistry: Secondary | ICD-10-CM | POA: Insufficient documentation

## 2017-11-23 NOTE — Progress Notes (Deleted)
[facility] day:  11/23/2017  Chief Complaint: Herbert Griffin is a 27 y.o. male with leukopenia and thrombocytopenia who is seen for review of work-up and discussion regarding direction of therapy.  HPI:  The patient was last seen in the hematology clinic on 11/16/2017 for initial consultation.  He described an acute illness with emesis and diarrhea.  CBC on 11/05/2017 revealed a hematocrit of 41.5, hemoglobin 14.1, platelets 81,000, white count 3100.   He underwent a work-up.   CBC revealed a hematocrit of 44.1, hemoglobin 15.1, platelets 200,000, white count 7000 with an [place] of 4900.  Differential included 69% segs, 19% lymphs, 8% monocytes, 3% eosinophils, and 1% basophils.  Peripheral smear revealed unremarkable white cells, red cells and platelets.  Glucose was 60, total protein 8.3, and ALT 71.  PT and PTT were normal.  ANA was negative.  B12 was 460.  Folate 26.  Hepatitis B surface antigen, hepatitis B core antibody total. and hepatitis C antibody were negative.  HIV testing was negative.  TSH was < 0.010.    Peripheral smear revealed unremarkable white cells, red cells and platelets.  CXR on 10/01/2017 was normal (none performed 11/16/2017).  Symptomatically,   Past Medical History:  Diagnosis Date  . Seizures ([place])     No past surgical history on file.  Family History  Problem Relation Age of Onset  . Cancer Maternal Aunt     Social History:  reports that he has never smoked. He has never used smokeless tobacco. He reports that he drinks alcohol. He reports that he has current or past drug history. Drug: Marijuana. Has previously abused BZOs (Xanax) in the past.  Patient denies known exposures to radiation on toxins.  He lives with her aunt. He is unemployed. He likes to play basketball.  The patient is accompanied by his mother and sister today.  Allergies: No Known Allergies  Current Medications: Current Outpatient  Medications  Medication Sig Dispense Refill  . levETIRAcetam (KEPPRA) 500 MG tablet Take 1 tablet (500 mg total) by mouth 2 (two) times daily. 60 tablet 0   No current facility-administered medications for this visit.     Review of Systems  Constitutional: Positive for weight loss (recent 8 pound weight loss). Negative for diaphoresis, fever and malaise/fatigue.       "I'm active. I play basketball."  HENT: Negative for nosebleeds and sore throat.        Rhinorrhea related to seasonal allergies. Chronic dental infections.  Eyes: Negative.   Respiratory: Positive for cough (x 2 months). Negative for hemoptysis, sputum production and shortness of breath.   Cardiovascular: Negative for chest pain, palpitations, orthopnea, leg swelling and PND.  Gastrointestinal: Negative for abdominal pain, blood in stool, constipation, diarrhea, melena, nausea and vomiting.  Genitourinary: Negative for dysuria, frequency, hematuria and urgency.  Musculoskeletal: Negative for back pain, falls, joint pain and myalgias.  Skin: Negative for itching and rash.  Neurological: Positive for seizures (last 1.5 months ago). Negative for dizziness, tremors, weakness and headaches.  Endo/Heme/Allergies: Does not bruise/bleed easily.  Psychiatric/Behavioral: Positive for substance abuse (History of Xanax abuse. Current marijuana. ). Negative for depression, memory loss and suicidal ideas. The patient is nervous/anxious. The patient does not have insomnia.   All other systems reviewed and are negative.  Performance status (ECOG): 0 - Asymptomatic  Physical Exam: There were no vitals taken for this visit. GENERAL:  Thin young man sitting comfortably in the exam room in  no acute distress. MENTAL STATUS:  Alert and oriented to person, place and time. HEAD:  Braided black hair pulled up with a rubber band on top of head.  Goatee.  Normocephalic, atraumatic, face symmetric, no Cushingoid features. EYES:  Brown eyes.  Pupils  equal round and reactive to light and accomodation.  No conjunctivitis or scleral icterus. ENT:  Oropharynx clear without lesion.  Tongue normal. Mucous membranes moist.  RESPIRATORY:  Clear to auscultation without rales, wheezes or rhonchi. CARDIOVASCULAR:  Regular rate and rhythm without murmur, rub or gallop. ABDOMEN:  Soft, non-tender, with active bowel sounds, and no hepatosplenomegaly.  No masses. SKIN:  Tattoos.  No rashes, ulcers or lesions. EXTREMITIES: No edema, no skin discoloration or tenderness.  No palpable cords. LYMPH NODES: No palpable cervical, supraclavicular, axillary or inguinal adenopathy  NEUROLOGICAL: Unremarkable. PSYCH:  Appropriate.  GENERAL:  Well developed, well nourished, gentleman sitting comfortably in the exam room in no acute distress. MENTAL STATUS:  Alert and oriented to person, place and time. HEAD:  *** hair.  Normocephalic, atraumatic, face symmetric, no Cushingoid features. EYES:  *** eyes.  Pupils equal round and reactive to light and accomodation.  No conjunctivitis or scleral icterus. ENT:  Oropharynx clear without lesion.  Tongue normal. Mucous membranes moist.  RESPIRATORY:  Clear to auscultation without rales, wheezes or rhonchi. CARDIOVASCULAR:  Regular rate and rhythm without murmur, rub or gallop. ABDOMEN:  Soft, non-tender, with active bowel sounds, and no hepatosplenomegaly.  No masses. SKIN:  No rashes, ulcers or lesions. EXTREMITIES: No edema, no skin discoloration or tenderness.  No palpable cords. LYMPH NODES: No palpable cervical, supraclavicular, axillary or inguinal adenopathy  NEUROLOGICAL: Unremarkable. PSYCH:  Appropriate.   No visits with results within 3 Day(s) from this visit.  Latest known visit with results is:  Orders Only on 11/16/2017  Component Date Value Ref Range Status  . Path Review 11/16/2017 Peripheral blood smear reviewed.   Final   Comment: Unremarkable WBC, RBC, and platelets. Reviewed by Dellia Nims Reuel Derby,  M.D. Performed at [facility], [address], [place], [place] [address]   . TSH 11/16/2017 <0.010* 0.350 - 4.500 uIU/mL Final   Comment: Performed by a 3rd Generation assay with a functional sensitivity of <=0.01 uIU/mL. Performed at [facility], [address] [place], [place] [address]   . Folate 11/16/2017 26.0  >5.9 ng/mL Final   Performed at [facility], [address] [place], [place] [address]  . Vitamin B-12 11/16/2017 460  180 - 914 pg/mL Final   Comment: (NOTE) This assay is not validated for testing neonatal or myeloproliferative syndrome specimens for Vitamin B12 levels. Performed at [facility], [place] [address], [place], [place] [address]   . HIV Screen 4th Generation wRfx 11/16/2017 Non Reactive  Non Reactive Final   Comment: (NOTE) Performed At: [facility] [place], [place] [id number] Rush Farmer MD [id number] Performed at [facility], [address] [place], [place] [address]   . HCV Ab 11/16/2017 <0.1  0.0 - 0.9 s/co ratio Final   Comment: (NOTE)                                  Negative:     < 0.8                             Indeterminate:  0.8 - 0.9                                  Positive:     > 0.9 The CDC recommends that a positive HCV antibody result be followed up with a HCV Nucleic Acid Amplification test (867619). Performed At: [facility] [place], [place] [id number] Rush Farmer MD [id number] Performed at [facility], [address], [place], [place] [address]   . Hepatitis B Surface Ag 11/16/2017 Negative  Negative Final   Comment: (NOTE) Performed At: [facility] [place], [place] [id number] Rush Farmer MD [id number] Performed at [facility], [address], [place], [place] [address]   . Hep B Core Total Ab 11/16/2017 Negative  Negative Final   Comment: (NOTE) Performed At: [facility] [place], [place] [id number] Rush Farmer MD [id number] Performed at [facility], [address] [place], [place] [address]   . Anti Nuclear Antibody(ANA) 11/16/2017 Negative  Negative Final   Comment: (NOTE) Performed At: [facility] [place], [place] [id number] Rush Farmer MD [id number] Performed at [facility], [address], [place], [place] [address]   . aPTT 11/16/2017 33  24 - 36 seconds Final   Performed at [facility], [address] [place], [place] [address]  . Prothrombin Time 11/16/2017 12.7  11.4 - 15.2 seconds Final  . INR 11/16/2017 0.96   Final   Performed at [facility], [address] [place], [place] [address]  . Sodium 11/16/2017 142  135 - 145 mmol/L Final  . Potassium 11/16/2017 3.6  3.5 - 5.1 mmol/L Final  . Chloride 11/16/2017 104  101 - 111 mmol/L Final  . CO2 11/16/2017 28  22 - 32 mmol/L Final  . Glucose, Bld 11/16/2017 60* 65 - 99 mg/dL Final  . BUN 11/16/2017 17  6 - 20 mg/dL Final  . Creatinine, Ser 11/16/2017 0.86  0.61 - 1.24 mg/dL Final  . Calcium 11/16/2017 9.8  8.9 - 10.3 mg/dL Final  . Total Protein 11/16/2017 8.3* 6.5 - 8.1 g/dL Final  . Albumin 11/16/2017 4.6  3.5 - 5.0 g/dL Final  . AST 11/16/2017 34  15 - 41 U/L Final  . ALT 11/16/2017 71* 17 - 63 U/L Final  . Alkaline Phosphatase 11/16/2017 73  38 - 126 U/L Final  . Total Bilirubin 11/16/2017 0.5  0.3 - 1.2 mg/dL Final  . GFR calc non Af Amer 11/16/2017 >60  >60 mL/min Final  . GFR calc Af Amer 11/16/2017 >60  >60 mL/min Final   Comment: (NOTE) The eGFR has been calculated using the CKD EPI equation. This calculation has not been validated in all clinical situations. eGFR's persistently <60 mL/min signify possible Chronic Kidney Disease.   Georgiann Hahn gap 11/16/2017 10  5 - 15 Final   Performed at [facility], [address] [place], [place] [address]  . WBC 11/16/2017 7.0  3.8 -  10.6 K/uL Final  . RBC 11/16/2017 5.16  4.40 - 5.90 MIL/uL Final  . Hemoglobin 11/16/2017 15.1  13.0 - 18.0 g/dL Final  . HCT 11/16/2017 44.1  40.0 - 52.0 % Final  . MCV 11/16/2017 85.5  80.0 - 100.0 fL Final  . MCH 11/16/2017 29.2  26.0 - 34.0 pg Final  . MCHC 11/16/2017 34.2  32.0 - 36.0 g/dL Final  .  RDW 11/16/2017 14.2  11.5 - 14.5 % Final  . Platelets 11/16/2017 200  150 - 440 K/uL Final  . Neutrophils Relative % 11/16/2017 69  % Final  . Neutro Abs 11/16/2017 4.9  1.4 - 6.5 K/uL Final  . Lymphocytes Relative 11/16/2017 19  % Final  . Lymphs Abs 11/16/2017 1.4  1.0 - 3.6 K/uL Final  . Monocytes Relative 11/16/2017 8  % Final  . Monocytes Absolute 11/16/2017 0.5  0.2 - 1.0 K/uL Final  . Eosinophils Relative 11/16/2017 3  % Final  . Eosinophils Absolute 11/16/2017 0.2  0 - 0.7 K/uL Final  . Basophils Relative 11/16/2017 1  % Final  . Basophils Absolute 11/16/2017 0.1  0 - 0.1 K/uL Final   Performed at [facility], [address] [place], [place] [address]    Assessment:  KAEDYN POLIVKA is a 27 y.o. male with leukopenia and thrombocytopenia of unclear duration.  Etiology appears secondary to an acute illness.  He denies any exposure to radiation or toxins.  Diet is poor.    CBC on 11/05/2017 revealed a hematocrit of 41.5, hemoglobin 14.1, platelets 81,000, white count 3100.  Work-up on 11/16/2017 revealed a normal CBC with diff.  Peripheral smear was unremarkable.  TSH was < 0.010.  Free T4 was 0.97 (0.82 - 1.77).  Normal studies included:  PT, PTT, ANA, hepatitis B surface antigen, hepatitis B core antibody total, hepatitis C antibody, HIV testing.  Total protein 8.3 (slightly elevated), and ALT 71.   He has a history of seizure disorder.  He is on Keppra.  Symptomatically, he feels "good".  He has a chronic cough.  Exam reveals no adenopathy or hepatosplenomegaly.  Plan: 1. Discuss work-up.  CBC normal.  Suspect transient suppression de to acute illness. 2. Discuss low  TSH with normal T4.  Follow-up with PCP.. 3. Discuss slightly elevated ALT and total protein.   4. CXR to evaluate chronic cough x 2 months.  5. Refer to neurology Melrose Nakayama) for evaluation of seizures.  6. RTC in 1 week for MD assessment, review of work up, and discussion regarding direction of therapy.    Lequita Asal, MD  11/23/2017, 1:06 PM   I saw and evaluated the patient, participating in the key portions of the service and reviewing pertinent diagnostic studies and records.  I reviewed the nurse practitioner's note and agree with the findings and the plan.  The assessment and plan were discussed with the patient.  A few questions were asked by the patient and answered.   Nolon Stalls, MD 11/23/2017,1:06 PM

## 2017-11-26 ENCOUNTER — Inpatient Hospital Stay: Payer: Self-pay | Admitting: Hematology and Oncology

## 2017-11-26 NOTE — Progress Notes (Deleted)
[facility] day:  11/26/2017  Chief Complaint: Herbert Griffin is a 27 y.o. male with leukopenia and thrombocytopenia who is seen for review of work-up and discussion regarding direction of therapy.  HPI:  The patient was last seen in the hematology clinic on 11/16/2017 for initial consultation.  He described an acute illness with emesis and diarrhea.  CBC on 11/05/2017 revealed a hematocrit of 41.5, hemoglobin 14.1, platelets 81,000, white count 3100.   He underwent a work-up.   CBC revealed a hematocrit of 44.1, hemoglobin 15.1, platelets 200,000, white count 7000 with an [place] of 4900.  Differential included 69% segs, 19% lymphs, 8% monocytes, 3% eosinophils, and 1% basophils.  Peripheral smear revealed unremarkable white cells, red cells and platelets.  Glucose was 60, total protein 8.3, and ALT 71.  PT and PTT were normal.  ANA was negative.  B12 was 460.  Folate 26.  Hepatitis B surface antigen, hepatitis B core antibody total. and hepatitis C antibody were negative.  HIV testing was negative.  TSH was < 0.010.    Peripheral smear revealed unremarkable white cells, red cells and platelets.  CXR on 10/01/2017 was normal (none performed 11/16/2017).  Symptomatically,   Past Medical History:  Diagnosis Date  . Seizures ([facility])     No past surgical history on file.  Family History  Problem Relation Age of Onset  . Cancer Maternal Aunt     Social History:  reports that he has never smoked. He has never used smokeless tobacco. He reports that he drinks alcohol. He reports that he has current or past drug history. Drug: Marijuana. Has previously abused BZOs (Xanax) in the past.  Patient denies known exposures to radiation on toxins.  He lives with her aunt. He is unemployed. He likes to play basketball.  The patient is accompanied by his mother and sister today.  Allergies: No Known Allergies  Current Medications: Current Outpatient  Medications  Medication Sig Dispense Refill  . levETIRAcetam (KEPPRA) 500 MG tablet Take 1 tablet (500 mg total) by mouth 2 (two) times daily. 60 tablet 0   No current facility-administered medications for this visit.     Review of Systems  Constitutional: Positive for weight loss (recent 8 pound weight loss). Negative for diaphoresis, fever and malaise/fatigue.       "I'm active. I play basketball."  HENT: Negative for nosebleeds and sore throat.        Rhinorrhea related to seasonal allergies. Chronic dental infections.  Eyes: Negative.   Respiratory: Positive for cough (x 2 months). Negative for hemoptysis, sputum production and shortness of breath.   Cardiovascular: Negative for chest pain, palpitations, orthopnea, leg swelling and PND.  Gastrointestinal: Negative for abdominal pain, blood in stool, constipation, diarrhea, melena, nausea and vomiting.  Genitourinary: Negative for dysuria, frequency, hematuria and urgency.  Musculoskeletal: Negative for back pain, falls, joint pain and myalgias.  Skin: Negative for itching and rash.  Neurological: Positive for seizures (last 1.5 months ago). Negative for dizziness, tremors, weakness and headaches.  Endo/Heme/Allergies: Does not bruise/bleed easily.  Psychiatric/Behavioral: Positive for substance abuse (History of Xanax abuse. Current marijuana. ). Negative for depression, memory loss and suicidal ideas. The patient is nervous/anxious. The patient does not have insomnia.   All other systems reviewed and are negative.  Performance status (ECOG): 0 - Asymptomatic  Physical Exam: There were no vitals taken for this visit. GENERAL:  Thin young man sitting comfortably in the exam room in  no acute distress. MENTAL STATUS:  Alert and oriented to person, place and time. HEAD:  Braided black hair pulled up with a rubber band on top of head.  Goatee.  Normocephalic, atraumatic, face symmetric, no Cushingoid features. EYES:  Brown eyes.  Pupils  equal round and reactive to light and accomodation.  No conjunctivitis or scleral icterus. ENT:  Oropharynx clear without lesion.  Tongue normal. Mucous membranes moist.  RESPIRATORY:  Clear to auscultation without rales, wheezes or rhonchi. CARDIOVASCULAR:  Regular rate and rhythm without murmur, rub or gallop. ABDOMEN:  Soft, non-tender, with active bowel sounds, and no hepatosplenomegaly.  No masses. SKIN:  Tattoos.  No rashes, ulcers or lesions. EXTREMITIES: No edema, no skin discoloration or tenderness.  No palpable cords. LYMPH NODES: No palpable cervical, supraclavicular, axillary or inguinal adenopathy  NEUROLOGICAL: Unremarkable. PSYCH:  Appropriate.  GENERAL:  Well developed, well nourished, gentleman sitting comfortably in the exam room in no acute distress. MENTAL STATUS:  Alert and oriented to person, place and time. HEAD:  *** hair.  Normocephalic, atraumatic, face symmetric, no Cushingoid features. EYES:  *** eyes.  Pupils equal round and reactive to light and accomodation.  No conjunctivitis or scleral icterus. ENT:  Oropharynx clear without lesion.  Tongue normal. Mucous membranes moist.  RESPIRATORY:  Clear to auscultation without rales, wheezes or rhonchi. CARDIOVASCULAR:  Regular rate and rhythm without murmur, rub or gallop. ABDOMEN:  Soft, non-tender, with active bowel sounds, and no hepatosplenomegaly.  No masses. SKIN:  No rashes, ulcers or lesions. EXTREMITIES: No edema, no skin discoloration or tenderness.  No palpable cords. LYMPH NODES: No palpable cervical, supraclavicular, axillary or inguinal adenopathy  NEUROLOGICAL: Unremarkable. PSYCH:  Appropriate.    No visits with results within 3 Day(s) from this visit.  Latest known visit with results is:  Orders Only on 11/16/2017  Component Date Value Ref Range Status  . Path Review 11/16/2017 Peripheral blood smear reviewed.   Final   Comment: Unremarkable WBC, RBC, and platelets. Reviewed by Dellia Nims Reuel Derby,  M.D. Performed at [facility], [address] [place], [place] [address]   . TSH 11/16/2017 <0.010* 0.350 - 4.500 uIU/mL Final   Comment: Performed by a 3rd Generation assay with a functional sensitivity of <=0.01 uIU/mL. Performed at [facility], [address] [place], [place] [address]   . Folate 11/16/2017 26.0  >5.9 ng/mL Final   Performed at [facility], [address] [place], [place] [address]  . Vitamin B-12 11/16/2017 460  180 - 914 pg/mL Final   Comment: (NOTE) This assay is not validated for testing neonatal or myeloproliferative syndrome specimens for Vitamin B12 levels. Performed at [facility], [place] [address] [place], [place] [address]   . HIV Screen 4th Generation wRfx 11/16/2017 Non Reactive  Non Reactive Final   Comment: (NOTE) Performed At: [facility] [place], [place] [id number] Rush Farmer MD [id number] Performed at [facility], [address] [place], [place] [address]   . HCV Ab 11/16/2017 <0.1  0.0 - 0.9 s/co ratio Final   Comment: (NOTE)                                  Negative:     < 0.8  Indeterminate: 0.8 - 0.9                                  Positive:     > 0.9 The CDC recommends that a positive HCV antibody result be followed up with a HCV Nucleic Acid Amplification test (884166). Performed At: [facility] [place], [place] [id number] Rush Farmer MD [id number] Performed at [facility], [address] [place], [place] [address]   . Hepatitis B Surface Ag 11/16/2017 Negative  Negative Final   Comment: (NOTE) Performed At: [facility] [place], [place] [id number] Rush Farmer MD [id number] Performed at [facility], [address] [place], [place] [address]   . Hep B Core Total Ab 11/16/2017 Negative  Negative Final   Comment: (NOTE) Performed At: [facility] [place], [place] [id number] Rush Farmer MD [id number] Performed at [facility], [address], [place], [place] [address]   . Anti Nuclear Antibody(ANA) 11/16/2017 Negative  Negative Final   Comment: (NOTE) Performed At: [facility] [place], [place] [id number] Rush Farmer MD [id number] Performed at [facility], [address] [place], [place] [address]   . aPTT 11/16/2017 33  24 - 36 seconds Final   Performed at [facility], [address] [place], [place] [address]  . Prothrombin Time 11/16/2017 12.7  11.4 - 15.2 seconds Final  . INR 11/16/2017 0.96   Final   Performed at [facility], [address] [place], [place] [address]  . Sodium 11/16/2017 142  135 - 145 mmol/L Final  . Potassium 11/16/2017 3.6  3.5 - 5.1 mmol/L Final  . Chloride 11/16/2017 104  101 - 111 mmol/L Final  . CO2 11/16/2017 28  22 - 32 mmol/L Final  . Glucose, Bld 11/16/2017 60* 65 - 99 mg/dL Final  . BUN 11/16/2017 17  6 - 20 mg/dL Final  . Creatinine, Ser 11/16/2017 0.86  0.61 - 1.24 mg/dL Final  . Calcium 11/16/2017 9.8  8.9 - 10.3 mg/dL Final  . Total Protein 11/16/2017 8.3* 6.5 - 8.1 g/dL Final  . Albumin 11/16/2017 4.6  3.5 - 5.0 g/dL Final  . AST 11/16/2017 34  15 - 41 U/L Final  . ALT 11/16/2017 71* 17 - 63 U/L Final  . Alkaline Phosphatase 11/16/2017 73  38 - 126 U/L Final  . Total Bilirubin 11/16/2017 0.5  0.3 - 1.2 mg/dL Final  . GFR calc non Af Amer 11/16/2017 >60  >60 mL/min Final  . GFR calc Af Amer 11/16/2017 >60  >60 mL/min Final   Comment: (NOTE) The eGFR has been calculated using the CKD EPI equation. This calculation has not been validated in all clinical situations. eGFR's persistently <60 mL/min signify possible Chronic Kidney Disease.   Georgiann Hahn gap 11/16/2017 10  5 - 15 Final   Performed at [facility], [address] [place],  [address]  . WBC 11/16/2017 7.0  3.8 -  10.6 K/uL Final  . RBC 11/16/2017 5.16  4.40 - 5.90 MIL/uL Final  . Hemoglobin 11/16/2017 15.1  13.0 - 18.0 g/dL Final  . HCT 11/16/2017 44.1  40.0 - 52.0 % Final  . MCV 11/16/2017 85.5  80.0 - 100.0 fL Final  . MCH 11/16/2017 29.2  26.0 - 34.0 pg Final  . MCHC 11/16/2017 34.2  32.0 - 36.0 g/dL Final  .  RDW 11/16/2017 14.2  11.5 - 14.5 % Final  . Platelets 11/16/2017 200  150 - 440 K/uL Final  . Neutrophils Relative % 11/16/2017 69  % Final  . Neutro Abs 11/16/2017 4.9  1.4 - 6.5 K/uL Final  . Lymphocytes Relative 11/16/2017 19  % Final  . Lymphs Abs 11/16/2017 1.4  1.0 - 3.6 K/uL Final  . Monocytes Relative 11/16/2017 8  % Final  . Monocytes Absolute 11/16/2017 0.5  0.2 - 1.0 K/uL Final  . Eosinophils Relative 11/16/2017 3  % Final  . Eosinophils Absolute 11/16/2017 0.2  0 - 0.7 K/uL Final  . Basophils Relative 11/16/2017 1  % Final  . Basophils Absolute 11/16/2017 0.1  0 - 0.1 K/uL Final   Performed at [facility], [address], [place], [place] [address]    Assessment:  HARRIET BOLLEN is a 27 y.o. male with leukopenia and thrombocytopenia of unclear duration.  Etiology appears secondary to an acute illness.  He denies any exposure to radiation or toxins.  Diet is poor.    CBC on 11/05/2017 revealed a hematocrit of 41.5, hemoglobin 14.1, platelets 81,000, white count 3100.  Work-up on 11/16/2017 revealed a normal CBC with diff.  Peripheral smear was unremarkable.  TSH was < 0.010.  Free T4 was 0.97 (0.82 - 1.77).  Normal studies included:  PT, PTT, ANA, hepatitis B surface antigen, hepatitis B core antibody total, hepatitis C antibody, HIV testing.  Total protein 8.3 (slightly elevated), and ALT 71.   He has a history of seizure disorder.  He is on Keppra.  Symptomatically, he feels "good".  He has a chronic cough.  Exam reveals no adenopathy or hepatosplenomegaly.  Plan: 1. Discuss work-up.  CBC normal.  Suspect transient suppression de to acute illness. 2. Discuss low  TSH with normal T4.  Follow-up with PCP.. 3. Discuss slightly elevated ALT and total protein.   4. CXR to evaluate chronic cough x 2 months.  5. Refer to neurology Melrose Nakayama) for evaluation of seizures.  6. RTC in 1 week for MD assessment, review of work up, and discussion regarding direction of therapy.    Lequita Asal, MD  11/26/2017, 6:50 AM   I saw and evaluated the patient, participating in the key portions of the service and reviewing pertinent diagnostic studies and records.  I reviewed the nurse practitioner's note and agree with the findings and the plan.  The assessment and plan were discussed with the patient.  A few questions were asked by the patient and answered.   Nolon Stalls, MD 11/26/2017,6:50 AM

## 2017-12-03 ENCOUNTER — Inpatient Hospital Stay: Payer: Self-pay | Admitting: Hematology and Oncology

## 2017-12-03 NOTE — Progress Notes (Deleted)
[facility] day:  12/03/2017  Chief Complaint: Herbert Griffin is a 27 y.o. male with leukopenia and thrombocytopenia who is seen for review of work-up and discussion regarding direction of therapy.  HPI:  The patient was last seen in the hematology clinic on 11/16/2017 for initial consultation.  He described an acute illness with emesis and diarrhea.  CBC on 11/05/2017 revealed a hematocrit of 41.5, hemoglobin 14.1, platelets 81,000, white count 3100.   He underwent a work-up.   CBC revealed a hematocrit of 44.1, hemoglobin 15.1, platelets 200,000, white count 7000 with an [place] of 4900.  Differential included 69% segs, 19% lymphs, 8% monocytes, 3% eosinophils, and 1% basophils.  Peripheral smear revealed unremarkable white cells, red cells and platelets.  Glucose was 60, total protein 8.3, and ALT 71.  PT and PTT were normal.  ANA was negative.  B12 was 460.  Folate 26.  Hepatitis B surface antigen, hepatitis B core antibody total. and hepatitis C antibody were negative.  HIV testing was negative.  TSH was < 0.010.    Peripheral smear revealed unremarkable white cells, red cells and platelets.  CXR on 10/01/2017 was normal (none performed 11/16/2017).  Symptomatically,   Past Medical History:  Diagnosis Date  . Seizures ([place])     No past surgical history on file.  Family History  Problem Relation Age of Onset  . Cancer Maternal Aunt     Social History:  reports that he has never smoked. He has never used smokeless tobacco. He reports that he drinks alcohol. He reports that he has current or past drug history. Drug: Marijuana. Has previously abused BZOs (Xanax) in the past.  Patient denies known exposures to radiation on toxins.  He lives with her aunt. He is unemployed. He likes to play basketball.  The patient is accompanied by his mother and sister today.  Allergies: No Known Allergies  Current Medications: Current Outpatient  Medications  Medication Sig Dispense Refill  . levETIRAcetam (KEPPRA) 500 MG tablet Take 1 tablet (500 mg total) by mouth 2 (two) times daily. 60 tablet 0   No current facility-administered medications for this visit.     Review of Systems  Constitutional: Positive for weight loss (recent 8 pound weight loss). Negative for diaphoresis, fever and malaise/fatigue.       "I'm active. I play basketball."  HENT: Negative for nosebleeds and sore throat.        Rhinorrhea related to seasonal allergies. Chronic dental infections.  Eyes: Negative.   Respiratory: Positive for cough (x 2 months). Negative for hemoptysis, sputum production and shortness of breath.   Cardiovascular: Negative for chest pain, palpitations, orthopnea, leg swelling and PND.  Gastrointestinal: Negative for abdominal pain, blood in stool, constipation, diarrhea, melena, nausea and vomiting.  Genitourinary: Negative for dysuria, frequency, hematuria and urgency.  Musculoskeletal: Negative for back pain, falls, joint pain and myalgias.  Skin: Negative for itching and rash.  Neurological: Positive for seizures (last 1.5 months ago). Negative for dizziness, tremors, weakness and headaches.  Endo/Heme/Allergies: Does not bruise/bleed easily.  Psychiatric/Behavioral: Positive for substance abuse (History of Xanax abuse. Current marijuana. ). Negative for depression, memory loss and suicidal ideas. The patient is nervous/anxious. The patient does not have insomnia.   All other systems reviewed and are negative.  Performance status (ECOG): 0 - Asymptomatic  Physical Exam: There were no vitals taken for this visit. GENERAL:  Thin young man sitting comfortably in the exam room in  no acute distress. MENTAL STATUS:  Alert and oriented to person, place and time. HEAD:  Braided black hair pulled up with a rubber band on top of head.  Goatee.  Normocephalic, atraumatic, face symmetric, no Cushingoid features. EYES:  Brown eyes.  Pupils  equal round and reactive to light and accomodation.  No conjunctivitis or scleral icterus. ENT:  Oropharynx clear without lesion.  Tongue normal. Mucous membranes moist.  RESPIRATORY:  Clear to auscultation without rales, wheezes or rhonchi. CARDIOVASCULAR:  Regular rate and rhythm without murmur, rub or gallop. ABDOMEN:  Soft, non-tender, with active bowel sounds, and no hepatosplenomegaly.  No masses. SKIN:  Tattoos.  No rashes, ulcers or lesions. EXTREMITIES: No edema, no skin discoloration or tenderness.  No palpable cords. LYMPH NODES: No palpable cervical, supraclavicular, axillary or inguinal adenopathy  NEUROLOGICAL: Unremarkable. PSYCH:  Appropriate.  GENERAL:  Well developed, well nourished, gentleman sitting comfortably in the exam room in no acute distress. MENTAL STATUS:  Alert and oriented to person, place and time. HEAD:  *** hair.  Normocephalic, atraumatic, face symmetric, no Cushingoid features. EYES:  *** eyes.  Pupils equal round and reactive to light and accomodation.  No conjunctivitis or scleral icterus. ENT:  Oropharynx clear without lesion.  Tongue normal. Mucous membranes moist.  RESPIRATORY:  Clear to auscultation without rales, wheezes or rhonchi. CARDIOVASCULAR:  Regular rate and rhythm without murmur, rub or gallop. ABDOMEN:  Soft, non-tender, with active bowel sounds, and no hepatosplenomegaly.  No masses. SKIN:  No rashes, ulcers or lesions. EXTREMITIES: No edema, no skin discoloration or tenderness.  No palpable cords. LYMPH NODES: No palpable cervical, supraclavicular, axillary or inguinal adenopathy  NEUROLOGICAL: Unremarkable. PSYCH:  Appropriate.    No visits with results within 3 Day(s) from this visit.  Latest known visit with results is:  Orders Only on 11/16/2017  Component Date Value Ref Range Status  . Path Review 11/16/2017 Peripheral blood smear reviewed.   Final   Comment: Unremarkable WBC, RBC, and platelets. Reviewed by Dellia Nims Reuel Derby,  M.D. Performed at [facility], [address] [place], [place] [address]   . TSH 11/16/2017 <0.010* 0.350 - 4.500 uIU/mL Final   Comment: Performed by a 3rd Generation assay with a functional sensitivity of <=0.01 uIU/mL. Performed at [facility], [address] [place], [place] [address]   . Folate 11/16/2017 26.0  >5.9 ng/mL Final   Performed at [facility], [address] [place], [place] [address]  . Vitamin B-12 11/16/2017 460  180 - 914 pg/mL Final   Comment: (NOTE) This assay is not validated for testing neonatal or myeloproliferative syndrome specimens for Vitamin B12 levels. Performed at [facility], [place] [address] [place], [place] [address]   . HIV Screen 4th Generation wRfx 11/16/2017 Non Reactive  Non Reactive Final   Comment: (NOTE) Performed At: [facility] [place], [place] [id number] Rush Farmer MD [id number] Performed at [facility], [address] [place], [place] [address]   . HCV Ab 11/16/2017 <0.1  0.0 - 0.9 s/co ratio Final   Comment: (NOTE)                                  Negative:     < 0.8  Indeterminate: 0.8 - 0.9                                  Positive:     > 0.9 The CDC recommends that a positive HCV antibody result be followed up with a HCV Nucleic Acid Amplification test (884166). Performed At: [facility] [place], [place] [id number] Rush Farmer MD [id number] Performed at [facility], [address] [place], [place] [address]   . Hepatitis B Surface Ag 11/16/2017 Negative  Negative Final   Comment: (NOTE) Performed At: [facility] [place], [place] [id number] Rush Farmer MD [id number] Performed at [facility], [address] [place], [place] [address]   . Hep B Core Total Ab 11/16/2017 Negative  Negative Final   Comment: (NOTE) Performed At: [facility] [place], [place] [id number] Rush Farmer MD [id number] Performed at [facility], [address], [place], [place] [address]   . Anti Nuclear Antibody(ANA) 11/16/2017 Negative  Negative Final   Comment: (NOTE) Performed At: [facility] [place], [place] [id number] Rush Farmer MD [id number] Performed at [facility], [address] [place], [place] [address]   . aPTT 11/16/2017 33  24 - 36 seconds Final   Performed at [facility], [address] [place], [place] [address]  . Prothrombin Time 11/16/2017 12.7  11.4 - 15.2 seconds Final  . INR 11/16/2017 0.96   Final   Performed at [facility], [address] [place], [place] [address]  . Sodium 11/16/2017 142  135 - 145 mmol/L Final  . Potassium 11/16/2017 3.6  3.5 - 5.1 mmol/L Final  . Chloride 11/16/2017 104  101 - 111 mmol/L Final  . CO2 11/16/2017 28  22 - 32 mmol/L Final  . Glucose, Bld 11/16/2017 60* 65 - 99 mg/dL Final  . BUN 11/16/2017 17  6 - 20 mg/dL Final  . Creatinine, Ser 11/16/2017 0.86  0.61 - 1.24 mg/dL Final  . Calcium 11/16/2017 9.8  8.9 - 10.3 mg/dL Final  . Total Protein 11/16/2017 8.3* 6.5 - 8.1 g/dL Final  . Albumin 11/16/2017 4.6  3.5 - 5.0 g/dL Final  . AST 11/16/2017 34  15 - 41 U/L Final  . ALT 11/16/2017 71* 17 - 63 U/L Final  . Alkaline Phosphatase 11/16/2017 73  38 - 126 U/L Final  . Total Bilirubin 11/16/2017 0.5  0.3 - 1.2 mg/dL Final  . GFR calc non Af Amer 11/16/2017 >60  >60 mL/min Final  . GFR calc Af Amer 11/16/2017 >60  >60 mL/min Final   Comment: (NOTE) The eGFR has been calculated using the CKD EPI equation. This calculation has not been validated in all clinical situations. eGFR's persistently <60 mL/min signify possible Chronic Kidney Disease.   Georgiann Hahn gap 11/16/2017 10  5 - 15 Final   Performed at [facility], [address] [place], [place] [address]  . WBC 11/16/2017 7.0  3.8 -  10.6 K/uL Final  . RBC 11/16/2017 5.16  4.40 - 5.90 MIL/uL Final  . Hemoglobin 11/16/2017 15.1  13.0 - 18.0 g/dL Final  . HCT 11/16/2017 44.1  40.0 - 52.0 % Final  . MCV 11/16/2017 85.5  80.0 - 100.0 fL Final  . MCH 11/16/2017 29.2  26.0 - 34.0 pg Final  . MCHC 11/16/2017 34.2  32.0 - 36.0 g/dL Final  .  RDW 11/16/2017 14.2  11.5 - 14.5 % Final  . Platelets 11/16/2017 200  150 - 440 K/uL Final  . Neutrophils Relative % 11/16/2017 69  % Final  . Neutro Abs 11/16/2017 4.9  1.4 - 6.5 K/uL Final  . Lymphocytes Relative 11/16/2017 19  % Final  . Lymphs Abs 11/16/2017 1.4  1.0 - 3.6 K/uL Final  . Monocytes Relative 11/16/2017 8  % Final  . Monocytes Absolute 11/16/2017 0.5  0.2 - 1.0 K/uL Final  . Eosinophils Relative 11/16/2017 3  % Final  . Eosinophils Absolute 11/16/2017 0.2  0 - 0.7 K/uL Final  . Basophils Relative 11/16/2017 1  % Final  . Basophils Absolute 11/16/2017 0.1  0 - 0.1 K/uL Final   Performed at [facility], [address], [place], [place] [address]    Assessment:  LAYN KYE is a 27 y.o. male with leukopenia and thrombocytopenia of unclear duration.  Etiology appears secondary to an acute illness.  He denies any exposure to radiation or toxins.  Diet is poor.    CBC on 11/05/2017 revealed a hematocrit of 41.5, hemoglobin 14.1, platelets 81,000, white count 3100.  Work-up on 11/16/2017 revealed a normal CBC with diff.  Peripheral smear was unremarkable.  TSH was < 0.010.  Free T4 was 0.97 (0.82 - 1.77).  Normal studies included:  PT, PTT, ANA, hepatitis B surface antigen, hepatitis B core antibody total, hepatitis C antibody, HIV testing.  Total protein 8.3 (slightly elevated), and ALT 71.   He has a history of seizure disorder.  He is on Keppra.  Symptomatically, he feels "good".  He has a chronic cough.  Exam reveals no adenopathy or hepatosplenomegaly.  Plan: 1. Discuss work-up.  CBC normal.  Suspect transient suppression de to acute illness. 2. Discuss low  TSH with normal T4.  Follow-up with PCP. 3. Discuss slightly elevated ALT and total protein.   4. CXR to evaluate chronic cough x 2 months.  5. Refer to neurology Melrose Nakayama) for evaluation of seizures.  6. RTC in 1 week for MD assessment, review of work up, and discussion regarding direction of therapy.    Lequita Asal, MD  12/03/2017, 4:12 AM   I saw and evaluated the patient, participating in the key portions of the service and reviewing pertinent diagnostic studies and records.  I reviewed the nurse practitioner's note and agree with the findings and the plan.  The assessment and plan were discussed with the patient.  A few questions were asked by the patient and answered.   Nolon Stalls, MD 12/03/2017,4:12 AM

## 2017-12-14 ENCOUNTER — Ambulatory Visit: Payer: Self-pay | Admitting: Hematology and Oncology

## 2017-12-14 ENCOUNTER — Inpatient Hospital Stay: Payer: Self-pay | Attending: Hematology and Oncology | Admitting: Hematology and Oncology

## 2017-12-14 NOTE — Progress Notes (Deleted)
[facility] day:  12/14/2017  Chief Complaint: Herbert Griffin is a 27 y.o. male with leukopenia and thrombocytopenia who is seen for review of work-up and discussion regarding direction of therapy.  HPI:  The patient was last seen in the hematology clinic on 11/16/2017 for initial consultation.  He described an acute illness with emesis and diarrhea.  CBC on 11/05/2017 revealed a hematocrit of 41.5, hemoglobin 14.1, platelets 81,000, white count 3100.   He underwent a work-up.   CBC revealed a hematocrit of 44.1, hemoglobin 15.1, platelets 200,000, white count 7000 with an [place] of 4900.  Differential included 69% segs, 19% lymphs, 8% monocytes, 3% eosinophils, and 1% basophils.  Peripheral smear revealed unremarkable white cells, red cells and platelets.  Glucose was 60, total protein 8.3, and ALT 71.  PT and PTT were normal.  ANA was negative.  B12 was 460.  Folate 26.  Hepatitis B surface antigen, hepatitis B core antibody total. and hepatitis C antibody were negative.  HIV testing was negative.  TSH was < 0.010.    Peripheral smear revealed unremarkable white cells, red cells and platelets.  CXR on 10/01/2017 was normal (none performed 11/16/2017).  Symptomatically,   Past Medical History:  Diagnosis Date  . Seizures ([place])     No past surgical history on file.  Family History  Problem Relation Age of Onset  . Cancer Maternal Aunt     Social History:  reports that he has never smoked. He has never used smokeless tobacco. He reports that he drinks alcohol. He reports that he has current or past drug history. Drug: Marijuana. Has previously abused BZOs (Xanax) in the past.  Patient denies known exposures to radiation on toxins.  He lives with her aunt. He is unemployed. He likes to play basketball.  The patient is accompanied by his mother and sister today.  Allergies: No Known Allergies  Current Medications: Current Outpatient  Medications  Medication Sig Dispense Refill  . levETIRAcetam (KEPPRA) 500 MG tablet Take 1 tablet (500 mg total) by mouth 2 (two) times daily. 60 tablet 0   No current facility-administered medications for this visit.     Review of Systems  Constitutional: Positive for weight loss (recent 8 pound weight loss). Negative for diaphoresis, fever and malaise/fatigue.       "I'm active. I play basketball."  HENT: Negative for nosebleeds and sore throat.        Rhinorrhea related to seasonal allergies. Chronic dental infections.  Eyes: Negative.   Respiratory: Positive for cough (x 2 months). Negative for hemoptysis, sputum production and shortness of breath.   Cardiovascular: Negative for chest pain, palpitations, orthopnea, leg swelling and PND.  Gastrointestinal: Negative for abdominal pain, blood in stool, constipation, diarrhea, melena, nausea and vomiting.  Genitourinary: Negative for dysuria, frequency, hematuria and urgency.  Musculoskeletal: Negative for back pain, falls, joint pain and myalgias.  Skin: Negative for itching and rash.  Neurological: Positive for seizures (last 1.5 months ago). Negative for dizziness, tremors, weakness and headaches.  Endo/Heme/Allergies: Does not bruise/bleed easily.  Psychiatric/Behavioral: Positive for substance abuse (History of Xanax abuse. Current marijuana. ). Negative for depression, memory loss and suicidal ideas. The patient is nervous/anxious. The patient does not have insomnia.   All other systems reviewed and are negative.  Performance status (ECOG): 0 - Asymptomatic  Physical Exam: There were no vitals taken for this visit. GENERAL:  Thin young man sitting comfortably in the exam room in  no acute distress. MENTAL STATUS:  Alert and oriented to person, place and time. HEAD:  Braided black hair pulled up with a rubber band on top of head.  Goatee.  Normocephalic, atraumatic, face symmetric, no Cushingoid features. EYES:  Brown eyes.  Pupils  equal round and reactive to light and accomodation.  No conjunctivitis or scleral icterus. ENT:  Oropharynx clear without lesion.  Tongue normal. Mucous membranes moist.  RESPIRATORY:  Clear to auscultation without rales, wheezes or rhonchi. CARDIOVASCULAR:  Regular rate and rhythm without murmur, rub or gallop. ABDOMEN:  Soft, non-tender, with active bowel sounds, and no hepatosplenomegaly.  No masses. SKIN:  Tattoos.  No rashes, ulcers or lesions. EXTREMITIES: No edema, no skin discoloration or tenderness.  No palpable cords. LYMPH NODES: No palpable cervical, supraclavicular, axillary or inguinal adenopathy  NEUROLOGICAL: Unremarkable. PSYCH:  Appropriate.  GENERAL:  Well developed, well nourished, gentleman sitting comfortably in the exam room in no acute distress. MENTAL STATUS:  Alert and oriented to person, place and time. HEAD:  *** hair.  Normocephalic, atraumatic, face symmetric, no Cushingoid features. EYES:  *** eyes.  Pupils equal round and reactive to light and accomodation.  No conjunctivitis or scleral icterus. ENT:  Oropharynx clear without lesion.  Tongue normal. Mucous membranes moist.  RESPIRATORY:  Clear to auscultation without rales, wheezes or rhonchi. CARDIOVASCULAR:  Regular rate and rhythm without murmur, rub or gallop. ABDOMEN:  Soft, non-tender, with active bowel sounds, and no hepatosplenomegaly.  No masses. SKIN:  No rashes, ulcers or lesions. EXTREMITIES: No edema, no skin discoloration or tenderness.  No palpable cords. LYMPH NODES: No palpable cervical, supraclavicular, axillary or inguinal adenopathy  NEUROLOGICAL: Unremarkable. PSYCH:  Appropriate.    No visits with results within 3 Day(s) from this visit.  Latest known visit with results is:  Orders Only on 11/16/2017  Component Date Value Ref Range Status  . Path Review 11/16/2017 Peripheral blood smear reviewed.   Final   Comment: Unremarkable WBC, RBC, and platelets. Reviewed by Dellia Nims Reuel Derby,  M.D. Performed at [facility], [address] [place], [place] [address]   . TSH 11/16/2017 <0.010* 0.350 - 4.500 uIU/mL Final   Comment: Performed by a 3rd Generation assay with a functional sensitivity of <=0.01 uIU/mL. Performed at [facility], [address] [place], [place] [address]   . Folate 11/16/2017 26.0  >5.9 ng/mL Final   Performed at [facility], [address] [place], [place] [address]  . Vitamin B-12 11/16/2017 460  180 - 914 pg/mL Final   Comment: (NOTE) This assay is not validated for testing neonatal or myeloproliferative syndrome specimens for Vitamin B12 levels. Performed at [facility], [place] [address] [place], [place] [address]   . HIV Screen 4th Generation wRfx 11/16/2017 Non Reactive  Non Reactive Final   Comment: (NOTE) Performed At: [facility] [place], [place] [id number] Rush Farmer MD [id number] Performed at [facility], [address] [place], [place] [address]   . HCV Ab 11/16/2017 <0.1  0.0 - 0.9 s/co ratio Final   Comment: (NOTE)                                  Negative:     < 0.8  Indeterminate: 0.8 - 0.9                                  Positive:     > 0.9 The CDC recommends that a positive HCV antibody result be followed up with a HCV Nucleic Acid Amplification test (884166). Performed At: [facility] [place], [place] [id number] Rush Farmer MD [id number] Performed at [facility], [address] [place], [place] [address]   . Hepatitis B Surface Ag 11/16/2017 Negative  Negative Final   Comment: (NOTE) Performed At: [facility] [place], [place] [id number] Rush Farmer MD [id number] Performed at [facility], [address] [place], [place] [address]   . Hep B Core Total Ab 11/16/2017 Negative  Negative Final   Comment: (NOTE) Performed At: [facility] [place], [place] [id number] Rush Farmer MD [id number] Performed at [facility], [address], [place], [place] [address]   . Anti Nuclear Antibody(ANA) 11/16/2017 Negative  Negative Final   Comment: (NOTE) Performed At: [facility] [place], [place] [id number] Rush Farmer MD [id number] Performed at [facility], [address] [place], [place] [address]   . aPTT 11/16/2017 33  24 - 36 seconds Final   Performed at [facility], [address] [place], [place] [address]  . Prothrombin Time 11/16/2017 12.7  11.4 - 15.2 seconds Final  . INR 11/16/2017 0.96   Final   Performed at [facility], [address] [place], [place] [address]  . Sodium 11/16/2017 142  135 - 145 mmol/L Final  . Potassium 11/16/2017 3.6  3.5 - 5.1 mmol/L Final  . Chloride 11/16/2017 104  101 - 111 mmol/L Final  . CO2 11/16/2017 28  22 - 32 mmol/L Final  . Glucose, Bld 11/16/2017 60* 65 - 99 mg/dL Final  . BUN 11/16/2017 17  6 - 20 mg/dL Final  . Creatinine, Ser 11/16/2017 0.86  0.61 - 1.24 mg/dL Final  . Calcium 11/16/2017 9.8  8.9 - 10.3 mg/dL Final  . Total Protein 11/16/2017 8.3* 6.5 - 8.1 g/dL Final  . Albumin 11/16/2017 4.6  3.5 - 5.0 g/dL Final  . AST 11/16/2017 34  15 - 41 U/L Final  . ALT 11/16/2017 71* 17 - 63 U/L Final  . Alkaline Phosphatase 11/16/2017 73  38 - 126 U/L Final  . Total Bilirubin 11/16/2017 0.5  0.3 - 1.2 mg/dL Final  . GFR calc non Af Amer 11/16/2017 >60  >60 mL/min Final  . GFR calc Af Amer 11/16/2017 >60  >60 mL/min Final   Comment: (NOTE) The eGFR has been calculated using the CKD EPI equation. This calculation has not been validated in all clinical situations. eGFR's persistently <60 mL/min signify possible Chronic Kidney Disease.   Georgiann Hahn gap 11/16/2017 10  5 - 15 Final   Performed at [facility], [address] [place], [place] [address]  . WBC 11/16/2017 7.0  3.8 -  10.6 K/uL Final  . RBC 11/16/2017 5.16  4.40 - 5.90 MIL/uL Final  . Hemoglobin 11/16/2017 15.1  13.0 - 18.0 g/dL Final  . HCT 11/16/2017 44.1  40.0 - 52.0 % Final  . MCV 11/16/2017 85.5  80.0 - 100.0 fL Final  . MCH 11/16/2017 29.2  26.0 - 34.0 pg Final  . MCHC 11/16/2017 34.2  32.0 - 36.0 g/dL Final  .  RDW 11/16/2017 14.2  11.5 - 14.5 % Final  . Platelets 11/16/2017 200  150 - 440 K/uL Final  . Neutrophils Relative % 11/16/2017 69  % Final  . Neutro Abs 11/16/2017 4.9  1.4 - 6.5 K/uL Final  . Lymphocytes Relative 11/16/2017 19  % Final  . Lymphs Abs 11/16/2017 1.4  1.0 - 3.6 K/uL Final  . Monocytes Relative 11/16/2017 8  % Final  . Monocytes Absolute 11/16/2017 0.5  0.2 - 1.0 K/uL Final  . Eosinophils Relative 11/16/2017 3  % Final  . Eosinophils Absolute 11/16/2017 0.2  0 - 0.7 K/uL Final  . Basophils Relative 11/16/2017 1  % Final  . Basophils Absolute 11/16/2017 0.1  0 - 0.1 K/uL Final   Performed at [facility], [address], [place], [place] [address]    Assessment:  LIBRADO GUANDIQUE is a 27 y.o. male with leukopenia and thrombocytopenia of unclear duration.  Etiology appears secondary to an acute illness.  He denies any exposure to radiation or toxins.  Diet is poor.    CBC on 11/05/2017 revealed a hematocrit of 41.5, hemoglobin 14.1, platelets 81,000, white count 3100.  Work-up on 11/16/2017 revealed a normal CBC with diff.  Peripheral smear was unremarkable.  TSH was < 0.010.  Free T4 was 0.97 (0.82 - 1.77).  Normal studies included:  PT, PTT, ANA, hepatitis B surface antigen, hepatitis B core antibody total, hepatitis C antibody, HIV testing.  Total protein 8.3 (slightly elevated), and ALT 71.   He has a history of seizure disorder.  He is on Keppra.  Symptomatically, he feels "good".  He has a chronic cough.  Exam reveals no adenopathy or hepatosplenomegaly.  Plan: 1. Discuss work-up.  CBC normal.  Suspect transient suppression de to acute illness. 2. Discuss low  TSH with normal T4.  Follow-up with PCP. 3. Discuss slightly elevated ALT and total protein.   4. CXR to evaluate chronic cough x 2 months.  5. Refer to neurology Melrose Nakayama) for evaluation of seizures.  6. RTC in 1 week for MD assessment, review of work up, and discussion regarding direction of therapy.    Lequita Asal, MD  12/14/2017, 5:34 AM   I saw and evaluated the patient, participating in the key portions of the service and reviewing pertinent diagnostic studies and records.  I reviewed the nurse practitioner's note and agree with the findings and the plan.  The assessment and plan were discussed with the patient.  A few questions were asked by the patient and answered.   Nolon Stalls, MD 12/14/2017,5:34 AM

## 2018-01-28 ENCOUNTER — Encounter: Payer: Self-pay | Admitting: *Deleted
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
## 2018-04-22 ENCOUNTER — Emergency Department
Admission: EM | Admit: 2018-04-22 | Discharge: 2018-04-22 | Disposition: A | Discharge status: Home / Self Care | Payer: Self-pay | Attending: Emergency Medicine | Admitting: Emergency Medicine

## 2018-04-22 ENCOUNTER — Other Ambulatory Visit: Payer: Self-pay

## 2018-04-22 DIAGNOSIS — Y999 Unspecified external cause status: Secondary | ICD-10-CM | POA: Insufficient documentation

## 2018-04-22 DIAGNOSIS — Z79899 Other long term (current) drug therapy: Secondary | ICD-10-CM | POA: Insufficient documentation

## 2018-04-22 DIAGNOSIS — Y939 Activity, unspecified: Secondary | ICD-10-CM | POA: Insufficient documentation

## 2018-04-22 DIAGNOSIS — W08XXXA Fall from other furniture, initial encounter: Secondary | ICD-10-CM | POA: Insufficient documentation

## 2018-04-22 DIAGNOSIS — S0101XA Laceration without foreign body of scalp, initial encounter: Secondary | ICD-10-CM | POA: Insufficient documentation

## 2018-04-22 DIAGNOSIS — Y929 Unspecified place or not applicable: Secondary | ICD-10-CM | POA: Insufficient documentation

## 2018-04-22 NOTE — Discharge Instructions (Addendum)
Return to the ER in 1 week for staple removal.  Return to the ER immediately for new or worsening headache, vomiting, weakness, or any other new or worsening symptoms that concern you.
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
## 2018-04-22 NOTE — ED Provider Notes (Signed)
[facility] Emergency Department Provider Note ____________________________________________   First MD Initiated Contact with Patient 04/22/18 1911     (approximate)  I have reviewed the triage vital signs and the nursing notes.   HISTORY  Chief Complaint Fall and Laceration    HPI Herbert Griffin is a 27 y.o. male with PMH as noted below who presents with a scalp laceration, sustained when he fell approximately 11 hours ago from a bench, not associated with LOC.  The patient reports mild nausea and some headache but states he is feeling better.  He did have one episode of vomiting after lunch this afternoon.  Patient is currently under arrest.  Past Medical History:  Diagnosis Date  . Seizures [facility])     Patient Active Problem List   Diagnosis Date Noted  . Elevated TSH 11/23/2017  . Elevated total protein 11/23/2017  . Leukopenia 11/16/2017  . Thrombocytopenia ([facility]) 11/16/2017  . Substance induced mood disorder ([facility]) 08/27/2016  . Benzodiazepine abuse ([facility]) 08/27/2016    No past surgical history on file.  Prior to Admission medications   Medication Sig Start Date End Date Taking? Authorizing Provider  levETIRAcetam (KEPPRA) 500 MG tablet Take 1 tablet (500 mg total) by mouth 2 (two) times daily. 03/07/17   Phineas Semen, MD    Allergies Patient has no known allergies.  Family History  Problem Relation Age of Onset  . Cancer Maternal Aunt     Social History Social History   Tobacco Use  . Smoking status: Never Smoker  . Smokeless tobacco: Never Used  Substance Use Topics  . Alcohol use: Yes    Comment: Occassionally  . Drug use: Yes    Types: Marijuana    Comment: Last use yesterday    Review of Systems  Constitutional: No fever. Eyes: No visual changes. ENT: No neck pain. Cardiovascular: Denies chest pain. Respiratory: Denies shortness of breath. Gastrointestinal: Positive for nausea.  Genitourinary: Negative for  flank pain.  Musculoskeletal: Negative for back pain. Skin: Negative for rash. Neurological: Positive for headaches, negative for focal weakness or numbness.   ____________________________________________   PHYSICAL EXAM:  VITAL SIGNS: ED Triage Vitals  Enc Vitals Group     BP 04/22/18 1855 113/65     Pulse Rate 04/22/18 1855 (!) 46     Resp 04/22/18 1855 15     Temp 04/22/18 1855 98.2 F (36.8 C)     Temp Source 04/22/18 1855 Oral     SpO2 04/22/18 1852 100 %     Weight 04/22/18 1857 153 lb (69.4 kg)     Height 04/22/18 1857 6' (1.829 m)     Head Circumference --      Peak Flow --      Pain Score 04/22/18 1856 8     Pain Loc --      Pain Edu? --      Excl. in GC? --     Constitutional: Alert and oriented.  Relatively well appearing and in no acute distress. Eyes: Conjunctivae are normal.  EOMI.  PERRLA. Head: 2 cm superficial laceration to left parietal scalp. Nose: No congestion/rhinnorhea. Mouth/Throat: Mucous membranes are moist.   Neck: Normal range of motion.  Cardiovascular: Good peripheral circulation. Respiratory: Normal respiratory effort.  Gastrointestinal: No distention.  Musculoskeletal: Extremities warm and well perfused.  Neurologic: Normal speech and language.  Motor and sensory intact in all extremities.  Normal coordination.  No gross focal neurologic deficits are appreciated.  Skin:  Skin  is warm and dry. No rash noted. Psychiatric: Mood and affect are normal. Speech and behavior are normal.  ____________________________________________   LABS (all labs ordered are listed, but only abnormal results are displayed)  Labs Reviewed - No data to display ____________________________________________  EKG   ____________________________________________  RADIOLOGY    ____________________________________________   PROCEDURES  Procedure(s) performed: Yes  .Marland KitchenLaceration Repair Date/Time: 04/22/2018 7:47 PM Performed by: Dionne Bucy,  MD Authorized by: Dionne Bucy, MD   Consent:    Consent obtained:  Verbal   Consent given by:  Patient   Risks discussed:  Infection, pain, retained foreign body, poor cosmetic result and poor wound healing Anesthesia (see MAR for exact dosages):    Anesthesia method:  None Laceration details:    Location:  Scalp   Scalp location:  L parietal   Length (cm):  2   Depth (mm):  2 Repair type:    Repair type:  Simple Exploration:    Hemostasis achieved with:  Direct pressure   Wound exploration: entire depth of wound probed and visualized     Contaminated: no   Treatment:    Amount of cleaning:  Standard Skin repair:    Repair method:  Staples   Number of staples:  2 Approximation:    Approximation:  Close Post-procedure details:    Dressing:  Open (no dressing)   Patient tolerance of procedure:  Tolerated well, no immediate complications    Critical Care performed: No ____________________________________________   INITIAL IMPRESSION / ASSESSMENT AND PLAN / ED COURSE  Pertinent labs & imaging results that were available during my care of the patient were reviewed by me and considered in my medical decision making (see chart for details).  27 year old male with PMH as noted above presents with a scalp laceration sustained after a fall approximately 11 hours ago.  The patient denies LOC at that time.  He does report some mild nausea, and states he vomited once this afternoon after lunch.  He is currently in police custody and is here for clearance.  On exam the patient is well-appearing.  His vital signs are normal (his heart rate is low although this is likely in the normal range for this young and relatively athletic appearing patient) and the remainder of the exam is as described above.  Neuro exam is nonfocal.  He has a superficial laceration to the left parieto-occipital region.  At this time given the duration since the injury and the normal neuro exam there is  no indication for imaging.  The laceration was repaired with staples.  The patient is medically cleared.  Return precautions given, and he expressed understanding. ____________________________________________   FINAL CLINICAL IMPRESSION(S) / ED DIAGNOSES  Final diagnoses:  Laceration of scalp, initial encounter      NEW MEDICATIONS STARTED DURING THIS VISIT:  New Prescriptions   No medications on file     Note:  This document was prepared using Dragon voice recognition software and may include unintentional dictation errors.    Dionne Bucy, MD 04/22/18 [phone]

## 2018-05-01 ENCOUNTER — Encounter: Payer: Self-pay | Admitting: Medical Oncology

## 2018-05-01 ENCOUNTER — Emergency Department
Admission: EM | Admit: 2018-05-01 | Discharge: 2018-05-01 | Disposition: A | Discharge status: Home / Self Care | Payer: Self-pay | Attending: Emergency Medicine | Admitting: Emergency Medicine

## 2018-05-01 DIAGNOSIS — S0101XD Laceration without foreign body of scalp, subsequent encounter: Secondary | ICD-10-CM | POA: Insufficient documentation

## 2018-05-01 DIAGNOSIS — X58XXXA Exposure to other specified factors, initial encounter: Secondary | ICD-10-CM | POA: Insufficient documentation

## 2018-05-01 DIAGNOSIS — Z4802 Encounter for removal of sutures: Secondary | ICD-10-CM

## 2018-05-01 DIAGNOSIS — Z4801 Encounter for change or removal of surgical wound dressing: Secondary | ICD-10-CM | POA: Insufficient documentation

## 2018-05-01 NOTE — ED Notes (Signed)
For  Suture  Removal top of head  Sutures placed  Nov 8  pt had  No problems

## 2018-05-01 NOTE — ED Provider Notes (Signed)
[facility] Emergency Department Provider Note  ____________________________________________  Time seen: Approximately 11:28 AM  I have reviewed the triage vital signs and the nursing notes.   HISTORY  Chief Complaint Suture / Staple Removal    HPI Herbert Griffin is a 27 y.o. male presents to emergency department for staple removal.  Staples were placed on November 8.  No concerns.   Past Medical History:  Diagnosis Date  . Seizures [facility](HCC)     Patient Active Problem List   Diagnosis Date Noted  . Elevated TSH 11/23/2017  . Elevated total protein 11/23/2017  . Leukopenia 11/16/2017  . Thrombocytopenia (HCC) 11/16/2017  . Substance induced mood disorder (HCC) 08/27/2016  . Benzodiazepine abuse (HCC) 08/27/2016    History reviewed. No pertinent surgical history.  Prior to Admission medications   Medication Sig Start Date End Date Taking? Authorizing Provider  levETIRAcetam (KEPPRA) 500 MG tablet Take 1 tablet (500 mg total) by mouth 2 (two) times daily. 03/07/17   Phineas SemenGoodman, Graydon, MD    Allergies Patient has no known allergies.  Family History  Problem Relation Age of Onset  . Cancer Maternal Aunt     Social History Social History   Tobacco Use  . Smoking status: Never Smoker  . Smokeless tobacco: Never Used  Substance Use Topics  . Alcohol use: Yes    Comment: Occassionally  . Drug use: Yes    Types: Marijuana    Comment: Last use yesterday     Review of Systems  Constitutional: No fever/chills Gastrointestinal:   No nausea, no vomiting.  Musculoskeletal: Negative for musculoskeletal pain. Skin: Negative for rash, abrasions, ecchymosis.  Positive for laceration with 2 staples. Neurological: Negative for headaches   ____________________________________________   PHYSICAL EXAM:  VITAL SIGNS: ED Triage Vitals  Enc Vitals Group     BP 05/01/18 1022 122/71     Pulse Rate 05/01/18 1022 (!) 46     Resp 05/01/18 1022 14     Temp 05/01/18 1022 97.6 F (36.4 C)     Temp Source 05/01/18 1022 Oral     SpO2 05/01/18 1022 100 %     Weight 05/01/18 1003 153 lb (69.4 kg)     Height 05/01/18 1003 6' (1.829 m)     Head Circumference --      Peak Flow --      Pain Score 05/01/18 1003 0     Pain Loc --      Pain Edu? --      Excl. in GC? --      Constitutional: Alert and oriented. Well appearing and in no acute distress. Eyes: Conjunctivae are normal. PERRL. EOMI. Head: Well-healed laceration to posterior scalp with 2 staples in place. ENT:      Ears:      Nose: No congestion/rhinnorhea.      Mouth/Throat: Mucous membranes are moist.  Neck: No stridor. Cardiovascular: Normal rate, regular rhythm.  Good peripheral circulation. Respiratory: Normal respiratory effort without tachypnea or retractions. Lungs CTAB. Good air entry to the bases with no decreased or absent breath sounds. Musculoskeletal: Full range of motion to all extremities. No gross deformities appreciated. Neurologic:  Normal speech and language. No gross focal neurologic deficits are appreciated.  Skin:  Skin is warm, dry. No rash noted. Psychiatric: Mood and affect are normal. Speech and behavior are normal. Patient exhibits appropriate insight and judgement.   ____________________________________________   LABS (all labs ordered are listed, but only abnormal results are displayed)  Labs Reviewed - No data to display ____________________________________________  EKG   ____________________________________________  RADIOLOGY   No results found.  ____________________________________________    PROCEDURES  Procedure(s) performed:    Procedures  SUTURE REMOVAL Performed by: Enid Derry  Consent: Verbal consent obtained. Patient identity confirmed: provided demographic data Time out: Immediately prior to procedure a "time out" was called to verify the correct patient, procedure, equipment, support staff and site/side  marked as required.  Location details: scalp  Wound Appearance: clean  Sutures/Staples Removed: 2  Facility: sutures placed in this facility Patient tolerance: Patient tolerated the procedure well with no immediate complications.   Medications - No data to display   ____________________________________________   INITIAL IMPRESSION / ASSESSMENT AND PLAN / ED COURSE  Pertinent labs & imaging results that were available during my care of the patient were reviewed by me and considered in my medical decision making (see chart for details).  Review of the [place] CSRS was performed in accordance of the [facility] prior to dispensing any controlled drugs.     Patient presented emergency department for staple removal.  Staples were removed without difficulty.  No indication of infection.  Laceration is well-healing.   Patient is to follow up with PCP as directed. Patient is given ED precautions to return to the ED for any worsening or new symptoms.     ____________________________________________  FINAL CLINICAL IMPRESSION(S) / ED DIAGNOSES  Final diagnoses:  Encounter for staple removal      NEW MEDICATIONS STARTED DURING THIS VISIT:  ED Discharge Orders    None          This chart was dictated using voice recognition software/Dragon. Despite best efforts to proofread, errors can occur which can change the meaning. Any change was purely unintentional.    Enid Derry, PA-C 05/01/18 1253    Pershing Proud Myra Rude, MD 05/01/18 [phone]

## 2018-05-01 NOTE — ED Triage Notes (Signed)
Pt reports he is here to have staples removed from head.

## 2018-05-05 ENCOUNTER — Emergency Department (HOSPITAL_COMMUNITY)
Admission: EM | Admit: 2018-05-05 | Discharge: 2018-05-05 | Disposition: A | Discharge status: Home / Self Care | Payer: Self-pay | Attending: Emergency Medicine | Admitting: Emergency Medicine

## 2018-05-05 ENCOUNTER — Other Ambulatory Visit: Payer: Self-pay

## 2018-05-05 ENCOUNTER — Encounter (HOSPITAL_COMMUNITY): Payer: Self-pay

## 2018-05-05 DIAGNOSIS — R569 Unspecified convulsions: Secondary | ICD-10-CM | POA: Insufficient documentation

## 2018-05-05 DIAGNOSIS — Z79899 Other long term (current) drug therapy: Secondary | ICD-10-CM | POA: Insufficient documentation

## 2018-05-05 DIAGNOSIS — F1910 Other psychoactive substance abuse, uncomplicated: Secondary | ICD-10-CM

## 2018-05-05 LAB — COMPREHENSIVE METABOLIC PANEL
ALBUMIN: 4.1 g/dL (ref 3.5–5.0)
ALT: 27 U/L (ref 0–44)
ANION GAP: 7 (ref 5–15)
AST: 21 U/L (ref 15–41)
Alkaline Phosphatase: 71 U/L (ref 38–126)
BUN: 14 mg/dL (ref 6–20)
CALCIUM: 9.1 mg/dL (ref 8.9–10.3)
CO2: 27 mmol/L (ref 22–32)
Chloride: 106 mmol/L (ref 98–111)
Creatinine, Ser: 0.98 mg/dL (ref 0.61–1.24)
GFR calc non Af Amer: >60 mL/min (ref >60)
GLUCOSE: 71 mg/dL (ref 70–99)
POTASSIUM: 4.1 mmol/L (ref 3.5–5.1)
SODIUM: 140 mmol/L (ref 135–145)
TOTAL PROTEIN: 7.1 g/dL (ref 6.5–8.1)
Total Bilirubin: 0.4 mg/dL (ref 0.3–1.2)

## 2018-05-05 LAB — URINALYSIS, ROUTINE W REFLEX MICROSCOPIC
BACTERIA UA: NONE SEEN
BILIRUBIN URINE: NEGATIVE
GLUCOSE, UA: NEGATIVE mg/dL
KETONES UR: NEGATIVE mg/dL
LEUKOCYTES UA: NEGATIVE
Nitrite: NEGATIVE
PH: 6 (ref 5.0–8.0)
PROTEIN: 30 mg/dL — AB
Specific Gravity, Urine: 1.015 (ref 1.005–1.030)

## 2018-05-05 LAB — CBC WITH DIFFERENTIAL/PLATELET
Abs Immature Granulocytes: 0.02 10*3/uL (ref 0.00–0.07)
BASOS ABS: 0 10*3/uL (ref 0.0–0.1)
BASOS PCT: 0 %
EOS ABS: 0.3 10*3/uL (ref 0.0–0.5)
EOS PCT: 4 %
HCT: 42.7 % (ref 39.0–52.0)
Hemoglobin: 13.9 g/dL (ref 13.0–17.0)
Immature Granulocytes: 0 %
LYMPHS PCT: 11 %
Lymphs Abs: 0.9 10*3/uL (ref 0.7–4.0)
MCH: 28.9 pg (ref 26.0–34.0)
MCHC: 32.6 g/dL (ref 30.0–36.0)
MCV: 88.8 fL (ref 80.0–100.0)
MONO ABS: 0.6 10*3/uL (ref 0.1–1.0)
Monocytes Relative: 8 %
NRBC: 0 % (ref 0.0–0.2)
Neutro Abs: 6.3 10*3/uL (ref 1.7–7.7)
Neutrophils Relative %: 77 %
Platelets: 119 10*3/uL — ABNORMAL LOW (ref 150–400)
RBC: 4.81 MIL/uL (ref 4.22–5.81)
RDW: 13.3 % (ref 11.5–15.5)
WBC: 8.2 10*3/uL (ref 4.0–10.5)

## 2018-05-05 LAB — RAPID URINE DRUG SCREEN, HOSP PERFORMED
Amphetamines: NOT DETECTED
BARBITURATES: NOT DETECTED
BENZODIAZEPINES: POSITIVE — AB
Cocaine: NOT DETECTED
Opiates: NOT DETECTED
Tetrahydrocannabinol: POSITIVE — AB

## 2018-05-05 LAB — CBG MONITORING, ED: GLUCOSE-CAPILLARY: 71 mg/dL (ref 70–99)

## 2018-05-05 MED ORDER — LEVETIRACETAM 500 MG PO TABS: 500.0000 mg | ORAL_TABLET | Freq: Once | ORAL | Status: DC

## 2018-05-05 MED ORDER — LEVETIRACETAM 500 MG PO TABS: 500.0000 mg | ORAL_TABLET | Freq: Two times a day (BID) | ORAL | 0 refills | Status: AC

## 2018-05-05 MED ORDER — ACETAMINOPHEN 325 MG PO TABS
650.0000 mg | ORAL_TABLET | Freq: Once | ORAL | Status: AC
  Administered 2018-05-05: 650 mg via ORAL
  Filled 2018-05-05: qty 2

## 2018-05-05 NOTE — ED Notes (Signed)
Bed: WA02 Expected date:  Expected time:  Means of arrival:  Comments: EMS seizure 
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
## 2018-05-05 NOTE — Discharge Instructions (Addendum)
Take Keppra as directed.  Follow up with your Physician.  Resources for substance abuse are provided

## 2018-05-05 NOTE — ED Provider Notes (Signed)
[facility]-EMERGENCY DEPT Provider Note   CSN: [id number] Arrival date & time: 05/05/18  1711     History   Chief Complaint Chief Complaint  Patient presents with  . Seizures    HPI Herbert Griffin is a 27 y.o. male.  The history is provided by the patient. No language interpreter was used.  Seizures   This is a recurrent problem. The current episode started 1 to 2 hours ago. The problem has been rapidly improving. There was 1 seizure. Pertinent negatives include no sleepiness and no confusion. The episode was witnessed. The seizure(s) had no focality. Possible causes include missed seizure meds and change in alcohol use.   Pt reports he had a seizure today.  Pt states he is heavy into drugs and alcohol.  Pt states he has not used anything today.  Pt reports he has seizures from withdrawal.  Pt states he is suppose to take keppra.  Pt states he doesn't trust it.  Past Medical History:  Diagnosis Date  . Seizures [facility](HCC)     Patient Active Problem List   Diagnosis Date Noted  . Elevated TSH 11/23/2017  . Elevated total protein 11/23/2017  . Leukopenia 11/16/2017  . Thrombocytopenia (HCC) 11/16/2017  . Substance induced mood disorder (HCC) 08/27/2016  . Benzodiazepine abuse (HCC) 08/27/2016    History reviewed. No pertinent surgical history.      Home Medications    Prior to Admission medications   Medication Sig Start Date End Date Taking? Authorizing Provider  levETIRAcetam (KEPPRA) 500 MG tablet Take 1 tablet (500 mg total) by mouth 2 (two) times daily. 03/07/17   Phineas SemenGoodman, Graydon, MD    Family History Family History  Problem Relation Age of Onset  . Cancer Maternal Aunt     Social History Social History   Tobacco Use  . Smoking status: Never Smoker  . Smokeless tobacco: Never Used  Substance Use Topics  . Alcohol use: Yes    Comment: Occassionally  . Drug use: Yes    Types: Marijuana    Comment: Last use yesterday      Allergies   Patient has no known allergies.   Review of Systems Review of Systems  Neurological: Positive for seizures.  Psychiatric/Behavioral: Negative for confusion.  All other systems reviewed and are negative.    Physical Exam Updated Vital Signs BP 117/70   Pulse 64   Temp 97.9 F (36.6 C)   Resp 16   Ht 6' (1.829 m)   Wt 68 kg   SpO2 100%   BMI 20.34 kg/m   Physical Exam  Constitutional: He appears well-developed and well-nourished.  HENT:  Head: Normocephalic and atraumatic.  Right Ear: External ear normal.  Left Ear: External ear normal.  Nose: Nose normal.  Mouth/Throat: Oropharynx is clear and moist.  Eyes: Conjunctivae are normal.  Neck: Neck supple.  Cardiovascular: Normal rate and regular rhythm.  No murmur heard. Pulmonary/Chest: Effort normal and breath sounds normal. No respiratory distress.  Abdominal: Soft. There is no tenderness.  Musculoskeletal: He exhibits no edema.  Neurological: He is alert.  Skin: Skin is warm and dry.  Psychiatric: He has a normal mood and affect.  Nursing note and vitals reviewed.    ED Treatments / Results  Labs (all labs ordered are listed, but only abnormal results are displayed) Labs Reviewed  CBC WITH DIFFERENTIAL/PLATELET  COMPREHENSIVE METABOLIC PANEL  URINALYSIS, ROUTINE W REFLEX MICROSCOPIC  RAPID URINE DRUG SCREEN, HOSP PERFORMED  CBG MONITORING, ED  EKG None  Radiology No results found.  Procedures Procedures (including critical care time)  Medications Ordered in ED Medications - No data to display   Initial Impression / Assessment and Plan / ED Course  I have reviewed the triage vital signs and the nursing notes.  Pertinent labs & imaging results that were available during my care of the patient were reviewed by me and considered in my medical decision making (see chart for details).     MDM Pt looks good.  Pt is positive for THC and benzodiazepines.  Pt given po keppra/ Rx  for keppra.  Pt is advised to follow up for substance abuse counseling  Final Clinical Impressions(s) / ED Diagnoses   Final diagnoses:  Seizure [facility])    ED Discharge Orders         Ordered    levETIRAcetam (KEPPRA) 500 MG tablet  2 times daily     05/05/18 1902        An After Visit Summary was printed and given to the patient.    Elson Areas, [place] 05/05/18 [place]    Margarita Grizzle, MD 05/05/18 [phone]

## 2019-09-29 ENCOUNTER — Emergency Department
Admission: EM | Admit: 2019-09-29 | Discharge: 2019-09-29 | Disposition: A | Discharge status: Home / Self Care | Payer: Self-pay | Attending: Emergency Medicine | Admitting: Emergency Medicine

## 2019-09-29 ENCOUNTER — Other Ambulatory Visit: Payer: Self-pay

## 2019-09-29 ENCOUNTER — Encounter: Payer: Self-pay | Admitting: Emergency Medicine

## 2019-09-29 DIAGNOSIS — F329 Major depressive disorder, single episode, unspecified: Secondary | ICD-10-CM | POA: Insufficient documentation

## 2019-09-29 DIAGNOSIS — Z634 Disappearance and death of family member: Secondary | ICD-10-CM | POA: Insufficient documentation

## 2019-09-29 DIAGNOSIS — R451 Restlessness and agitation: Secondary | ICD-10-CM | POA: Insufficient documentation

## 2019-09-29 DIAGNOSIS — R4589 Other symptoms and signs involving emotional state: Secondary | ICD-10-CM

## 2019-09-29 DIAGNOSIS — F99 Mental disorder, not otherwise specified: Secondary | ICD-10-CM

## 2019-09-29 DIAGNOSIS — Z046 Encounter for general psychiatric examination, requested by authority: Secondary | ICD-10-CM

## 2019-09-29 DIAGNOSIS — Z79899 Other long term (current) drug therapy: Secondary | ICD-10-CM | POA: Insufficient documentation

## 2019-09-29 LAB — COMPREHENSIVE METABOLIC PANEL
ALT: 14 U/L (ref 0–44)
AST: 17 U/L (ref 15–41)
Albumin: 4.1 g/dL (ref 3.5–5.0)
Alkaline Phosphatase: 55 U/L (ref 38–126)
Anion gap: 7 (ref 5–15)
BUN: 19 mg/dL (ref 6–20)
CO2: 28 mmol/L (ref 22–32)
Calcium: 9.3 mg/dL (ref 8.9–10.3)
Chloride: 104 mmol/L (ref 98–111)
Creatinine, Ser: 1.09 mg/dL (ref 0.61–1.24)
GFR calc Af Amer: >60 mL/min (ref >60)
GFR calc non Af Amer: >60 mL/min (ref >60)
Glucose, Bld: 89 mg/dL (ref 70–99)
Potassium: 3.8 mmol/L (ref 3.5–5.1)
Sodium: 139 mmol/L (ref 135–145)
Total Bilirubin: 0.7 mg/dL (ref 0.3–1.2)
Total Protein: 6.9 g/dL (ref 6.5–8.1)

## 2019-09-29 LAB — SALICYLATE LEVEL: Salicylate Lvl: <7 mg/dL — ABNORMAL LOW (ref 7.0–30.0)

## 2019-09-29 LAB — URINE DRUG SCREEN, QUALITATIVE (ARMC ONLY)
Amphetamines, Ur Screen: NOT DETECTED
Barbiturates, Ur Screen: NOT DETECTED
Benzodiazepine, Ur Scrn: NOT DETECTED
Cannabinoid 50 Ng, Ur ~~LOC~~: POSITIVE — AB
Cocaine Metabolite,Ur ~~LOC~~: NOT DETECTED
MDMA (Ecstasy)Ur Screen: NOT DETECTED
Methadone Scn, Ur: NOT DETECTED
Opiate, Ur Screen: NOT DETECTED
Phencyclidine (PCP) Ur S: NOT DETECTED
Tricyclic, Ur Screen: NOT DETECTED

## 2019-09-29 LAB — CBC
HCT: 41 % (ref 39.0–52.0)
Hemoglobin: 14.1 g/dL (ref 13.0–17.0)
MCH: 30.5 pg (ref 26.0–34.0)
MCHC: 34.4 g/dL (ref 30.0–36.0)
MCV: 88.7 fL (ref 80.0–100.0)
Platelets: 151 10*3/uL (ref 150–400)
RBC: 4.62 MIL/uL (ref 4.22–5.81)
RDW: 13.2 % (ref 11.5–15.5)
WBC: 8.3 10*3/uL (ref 4.0–10.5)
nRBC: 0 % (ref 0.0–0.2)

## 2019-09-29 LAB — ACETAMINOPHEN LEVEL: Acetaminophen (Tylenol), Serum: <10 ug/mL — ABNORMAL LOW (ref 10–30)

## 2019-09-29 LAB — ETHANOL: Alcohol, Ethyl (B): <10 mg/dL (ref <10)

## 2019-09-29 NOTE — BH Assessment (Signed)
Assessment Note  Herbert Griffin is an 29 y.o. male presenting to [facility] ED voluntarily. Per triage note Pt to ED via POV with his brother who states that pt is here because he was court ordered to have psych evaluation. Per pt brother and pt, they do not know why he was supposed to have psych evaluation, pt brother stated "it was either do this or he was going to have to do some time". Pt denies previous mental health hx. Pt denies thoughts of harming himself or anyone else. Pt is in NAD. During assessment patient is alert, pleasant, oriented x4 and reported "my probation officer made me come here for a psyc eval." Patient is unsure why his probation officer asked that he comes here. Patient reported that a close friend of his died in a shooting not long ago and he went to his friends funeral today "I guess my mom thought I wasn't taking it well but everyone grieves, I didn't end up relapsing though." Patient reported that he has been clean from "pills" since being on probation. Patient reported that he is complying with everything that he has to do for probation. Patient denies any mental health issues, and denies SI/HI/AH/VH patient does not appear to be responding to any internal or external stimuli.   Per Psyc NP patient does not meet criteria for Inpatient Hospitalization and is recommended for discharge.   Diagnosis: Psychiatric Evaluation  Past Medical History:  Past Medical History:  Diagnosis Date  . Seizures (HCC)     History reviewed. No pertinent surgical history.  Family History:  Family History  Problem Relation Age of Onset  . Cancer Maternal Aunt     Social History:  reports that he has never smoked. He has never used smokeless tobacco. He reports current alcohol use. He reports current drug use. Drug: Marijuana.  Additional Social History:  Alcohol / Drug Use Pain Medications: See MAR Prescriptions: See MAR Over the Counter: See MAR History of alcohol / drug use?:  Yes Substance #1 Name of Substance 1: Marijuana  CIWA: CIWA-Ar BP: (!) 105/56 Pulse Rate: 65 COWS:    Allergies: No Known Allergies  Home Medications: (Not in a hospital admission)   OB/GYN Status:  No LMP for male patient.  General Assessment Data Location of Assessment: [facility] ED TTS Assessment: In system Is this a Tele or Face-to-Face Assessment?: Face-to-Face Is this an Initial Assessment or a Re-assessment for this encounter?: Initial Assessment Patient Accompanied by:: N/A Language Other than English: No Living Arrangements: Other (Comment) What gender do you identify as?: Male Marital status: Single Living Arrangements: Parent Can pt return to current living arrangement?: Yes Admission Status: Voluntary Is patient capable of signing voluntary admission?: Yes Referral Source: Self/Family/Friend Insurance type: None  Medical Screening Exam [facility] Walk-in ONLY) Medical Exam completed: Yes  Crisis Care Plan Living Arrangements: Parent Legal Guardian: Other:(Self) Name of Psychiatrist: None Name of Therapist: None  Education Status Is patient currently in school?: No Is the patient employed, unemployed or receiving disability?: Unemployed  Risk to self with the past 6 months Suicidal Ideation: No Has patient been a risk to self within the past 6 months prior to admission? : No Suicidal Intent: No Has patient had any suicidal intent within the past 6 months prior to admission? : No Is patient at risk for suicide?: No Suicidal Plan?: No Has patient had any suicidal plan within the past 6 months prior to admission? : No Access to Means: No What has  been your use of drugs/alcohol within the last 12 months?: Marijuana Previous Attempts/Gestures: No How many times?: 0 Other Self Harm Risks: None Triggers for Past Attempts: None known Intentional Self Injurious Behavior: None Family Suicide History: Unknown Recent stressful life event(s): Other (Comment), Legal  Issues(Current probation) Persecutory voices/beliefs?: No Depression: No Substance abuse history and/or treatment for substance abuse?: No Suicide prevention information given to non-admitted patients: Not applicable  Risk to Others within the past 6 months Homicidal Ideation: No Does patient have any lifetime risk of violence toward others beyond the six months prior to admission? : No Thoughts of Harm to Others: No Current Homicidal Intent: No Current Homicidal Plan: No Access to Homicidal Means: No Identified Victim: None History of harm to others?: No Assessment of Violence: None Noted Violent Behavior Description: None Does patient have access to weapons?: No Criminal Charges Pending?: No Describe Pending Criminal Charges: None pendinig currently on probation Does patient have a court date: No Is patient on probation?: Yes  Psychosis Hallucinations: None noted Delusions: None noted  Mental Status Report Appearance/Hygiene: In scrubs Eye Contact: Good Motor Activity: Freedom of movement Speech: Logical/coherent Level of Consciousness: Alert Mood: Pleasant Affect: Appropriate to circumstance Anxiety Level: Minimal Thought Processes: Coherent Judgement: Unimpaired Orientation: Person, Place, Time, Situation, Appropriate for developmental age Obsessive Compulsive Thoughts/Behaviors: None  Cognitive Functioning Concentration: Normal Memory: Recent Intact, Remote Intact Is patient IDD: No Insight: Good Impulse Control: Good Appetite: Good Have you had any weight changes? : No Change Sleep: No Change Total Hours of Sleep: 8 Vegetative Symptoms: None  ADLScreening [facility] Assessment Services) Patient's cognitive ability adequate to safely complete daily activities?: Yes Patient able to express need for assistance with ADLs?: Yes Independently performs ADLs?: Yes (appropriate for developmental age)  Prior Inpatient Therapy Prior Inpatient Therapy: No  Prior  Outpatient Therapy Prior Outpatient Therapy: No Does patient have an ACCT team?: No Does patient have Intensive In-House Services?  : No Does patient have [facility] services? : No Does patient have P4CC services?: No  ADL Screening (condition at time of admission) Patient's cognitive ability adequate to safely complete daily activities?: Yes Is the patient deaf or have difficulty hearing?: No Does the patient have difficulty seeing, even when wearing glasses/contacts?: No Does the patient have difficulty concentrating, remembering, or making decisions?: No Patient able to express need for assistance with ADLs?: Yes Does the patient have difficulty dressing or bathing?: No Independently performs ADLs?: Yes (appropriate for developmental age) Does the patient have difficulty walking or climbing stairs?: No Weakness of Legs: None Weakness of Arms/Hands: None  Home Assistive Devices/Equipment Home Assistive Devices/Equipment: None  Therapy Consults (therapy consults require a physician order) PT Evaluation Needed: No OT Evalulation Needed: No SLP Evaluation Needed: No Abuse/Neglect Assessment (Assessment to be complete while patient is alone) Abuse/Neglect Assessment Can Be Completed: Yes Physical Abuse: Denies Verbal Abuse: Denies Sexual Abuse: Denies Exploitation of patient/patient's resources: Denies Self-Neglect: Denies Values / Beliefs Cultural Requests During Hospitalization: None Spiritual Requests During Hospitalization: None Consults Spiritual Care Consult Needed: No Transition of Care Team Consult Needed: No Advance Directives (For Healthcare) Does Patient Have a Medical Advance Directive?: No Would patient like information on creating a medical advance directive?: No - Patient declined          Disposition: Per Psyc NP patient does not meet criteria for Inpatient Hospitalization and is recommended for discharge.   Disposition Initial Assessment Completed for  this Encounter: Yes  On Site Evaluation by:   Reviewed with  Physician:    Leonie Douglas MS [place] 09/29/2019 9:33 PM

## 2019-09-29 NOTE — ED Provider Notes (Signed)
[facility] EMERGENCY DEPARTMENT Provider Note   CSN: [id number] Arrival date & time: 09/29/19  1845     History Chief Complaint  Patient presents with  . Psychiatric Evaluation    YOSMAR RYKER is a 29 y.o. male history of seizures here presenting with depression, agitation.  Patient states that 2 weeks ago, his friend died. Today is his funeral so he has been very upset.  He has been crying all day.  Apparently he saw the parole officer recently and she said something and got him very upset.  He denies any suicidal homicidal ideations or hallucinations.  She states that the parole officer requested that he needs a psych eval or else he may need to go back to jail.  He admits to using marijuana but denies any drug or alcohol use.  The history is provided by the patient.       Past Medical History:  Diagnosis Date  . Seizures [facility])     Patient Active Problem List   Diagnosis Date Noted  . Elevated TSH 11/23/2017  . Elevated total protein 11/23/2017  . Leukopenia 11/16/2017  . Thrombocytopenia (HCC) 11/16/2017  . Substance induced mood disorder (HCC) 08/27/2016  . Benzodiazepine abuse (HCC) 08/27/2016    History reviewed. No pertinent surgical history.     Family History  Problem Relation Age of Onset  . Cancer Maternal Aunt     Social History   Tobacco Use  . Smoking status: Never Smoker  . Smokeless tobacco: Never Used  Substance Use Topics  . Alcohol use: Yes    Comment: Occassionally  . Drug use: Yes    Types: Marijuana    Comment: Last use yesterday    Home Medications Prior to Admission medications   Medication Sig Start Date End Date Taking? Authorizing Provider  levETIRAcetam (KEPPRA) 500 MG tablet Take 1 tablet (500 mg total) by mouth 2 (two) times daily. 05/05/18   Elson Areas, PA-C    Allergies    Patient has no known allergies.  Review of Systems   Review of Systems  Psychiatric/Behavioral: Positive for  dysphoric mood.  All other systems reviewed and are negative.   Physical Exam Updated Vital Signs BP (!) 105/56 (BP Location: Left Arm)   Pulse 65   Temp 98.1 F (36.7 C) (Oral)   Resp 16   Ht 6' (1.829 m)   Wt 73 kg   SpO2 97%   BMI 21.84 kg/m   Physical Exam Vitals and nursing note reviewed.  HENT:     Head: Normocephalic.     Right Ear: Tympanic membrane normal.     Nose: Nose normal.     Mouth/Throat:     Mouth: Mucous membranes are moist.  Eyes:     Pupils: Pupils are equal, round, and reactive to light.  Cardiovascular:     Rate and Rhythm: Normal rate and regular rhythm.     Pulses: Normal pulses.     Heart sounds: Normal heart sounds.  Pulmonary:     Effort: Pulmonary effort is normal.  Abdominal:     General: Abdomen is flat.  Musculoskeletal:        General: Normal range of motion.     Cervical back: Normal range of motion.  Skin:    General: Skin is warm.     Capillary Refill: Capillary refill takes less than 2 seconds.  Neurological:     General: No focal deficit present.     Mental Status:  He is alert and oriented to person, place, and time.  Psychiatric:        Mood and Affect: Mood normal.     ED Results / Procedures / Treatments   Labs (all labs ordered are listed, but only abnormal results are displayed) Labs Reviewed  SALICYLATE LEVEL - Abnormal; Notable for the following components:      Result Value   Salicylate Lvl <3.7 (*)    All other components within normal limits  ACETAMINOPHEN LEVEL - Abnormal; Notable for the following components:   Acetaminophen (Tylenol), Serum <10 (*)    All other components within normal limits  URINE DRUG SCREEN, QUALITATIVE ([facility] ONLY) - Abnormal; Notable for the following components:   Cannabinoid 50 Ng, Ur  POSITIVE (*)    All other components within normal limits  COMPREHENSIVE METABOLIC PANEL  ETHANOL  CBC    EKG None  Radiology No results found.  Procedures Procedures (including  critical care time)  Medications Ordered in ED Medications - No data to display  ED Course  I have reviewed the triage vital signs and the nursing notes.  Pertinent labs & imaging results that were available during my care of the patient were reviewed by me and considered in my medical decision making (see chart for details).    MDM Rules/Calculators/A&P                      TJ KITCHINGS is a 29 y.o. male is here with dysphoric mood.  Best friend just died and today is the funeral so he is so upset.  Parole officer requested that he be evaluated by patient is voluntary currently.  Will get psych clearance labs and consult TTS.  Additional history obtained:  Previous records obtained and reviewed  Lab Tests:  I Ordered, reviewed, and interpreted labs, which included:   CBC, CMP, ETOH, UDS- marijuana   Consultations Obtained: I consulted TTS  and discussed lab and imaging findings  9:21 PM Patient labs are unremarkable.  UDS is positive for marijuana.  Psych saw patient and recommend discharge.    Final Clinical Impression(s) / ED Diagnoses Final diagnoses:  None    Rx / DC Orders ED Discharge Orders    None       Drenda Freeze, MD 09/29/19 2122

## 2019-09-29 NOTE — Discharge Instructions (Signed)
See your doctor   You were seen by psychiatry today and you were cleared   Return to ER if you have thoughts of harming yourself or others, hallucinations

## 2019-09-29 NOTE — ED Notes (Signed)
Pt states that he was told by his probation officer that he had to be on a 72 hour hold or he get a violation.  Herbert Griffin- [phone]

## 2019-09-29 NOTE — Consult Note (Signed)
[facility] Psychiatry Consult   Reason for Consult: Psych evaluation Referring Physician:  Dr. Darl Householder Patient Identification: Herbert Griffin MRN:  [id number] Principal Diagnosis: Psychiatric exam requested by authority Diagnosis:  Principal Problem:   Psychiatric exam requested by authority Active Problems:   Psychiatric diagnosis   Total Time spent with patient: 30 minutes  Subjective:   Herbert Griffin is a 29 y.o. male patient admitted at the request of his parole officer.   HPI:  Per TTS: Herbert Griffin is an 29 y.o. male presenting to [facility] ED voluntarily. Per triage note Pt to ED via POV with his brother who states that pt is here because he was court ordered to have psych evaluation. Per pt brother and pt, they do not know why he was supposed to have psych evaluation, pt brother stated "it was either do this or he was going to have to do some time". Pt denies previous mental health hx. Pt denies thoughts of harming himself or anyone else. Pt is in NAD.During assessment patient is alert, pleasant, oriented x4 and reported "my probation officer made me come here for a psyc eval." Patient is unsure why his probation officer asked that he comes here. Patient reported that a close friend of his died in a shooting not long ago and he went to his friends funeral today "I guess my mom thought I wasn't taking it well but everyone grieves, I didn't end up relapsing though." Patient reported that he has been clean from "pills" since being on probation. Patient reported that he is complying with everything that he has to do for probation. Patient denies any mental health issues, and denies SI/HI/AH/VH patient does not appear to be responding to any internal or external stimuli.   Patient presents clear in thought and in speech. Patient acknowledges that he is mourning the death of his best friend.  Writer believes the level of grief is appropriate to situation.  Patient denies SI, HI, and AVH.   Patient denies psychiatric diagnosis.  Patient is psych cleared. Past Psychiatric History: denies   Risk to Self: Suicidal Ideation: No Suicidal Intent: No Is patient at risk for suicide?: No Suicidal Plan?: No Access to Means: No What has been your use of drugs/alcohol within the last 12 months?: Marijuana How many times?: 0 Other Self Harm Risks: None Triggers for Past Attempts: None known Intentional Self Injurious Behavior: None Risk to Others: Homicidal Ideation: No Thoughts of Harm to Others: No Current Homicidal Intent: No Current Homicidal Plan: No Access to Homicidal Means: No Identified Victim: None History of harm to others?: No Assessment of Violence: None Noted Violent Behavior Description: None Does patient have access to weapons?: No Criminal Charges Pending?: No Describe Pending Criminal Charges: None pendinig currently on probation Does patient have a court date: No Prior Inpatient Therapy: Prior Inpatient Therapy: No Prior Outpatient Therapy: Prior Outpatient Therapy: No Does patient have an ACCT team?: No Does patient have Intensive In-House Services?  : No Does patient have [facility] services? : No Does patient have P4CC services?: No  Past Medical History:  Past Medical History:  Diagnosis Date  . Seizures ([place])    History reviewed. No pertinent surgical history. Family History:  Family History  Problem Relation Age of Onset  . Cancer Maternal Aunt    Family Psychiatric  History: unknown Social History:  Social History   Substance and Sexual Activity  Alcohol Use Yes   Comment: Occassionally     Social History  Substance and Sexual Activity  Drug Use Yes  . Types: Marijuana   Comment: Last use yesterday    Social History   Socioeconomic History  . Marital status: Single    Spouse name: Not on file  . Number of children: Not on file  . Years of education: Not on file  . Highest education level: Not on file  Occupational History   . Not on file  Tobacco Use  . Smoking status: Never Smoker  . Smokeless tobacco: Never Used  Substance and Sexual Activity  . Alcohol use: Yes    Comment: Occassionally  . Drug use: Yes    Types: Marijuana    Comment: Last use yesterday  . Sexual activity: Not on file  Other Topics Concern  . Not on file  Social History Narrative  . Not on file   Social Determinants of Health   Financial Resource Strain:   . Difficulty of Paying Living Expenses:   Food Insecurity:   . Worried About Programme researcher, broadcasting/film/video in the Last Year:   . Barista in the Last Year:   Transportation Needs:   . Freight forwarder (Medical):   Marland Kitchen Lack of Transportation (Non-Medical):   Physical Activity:   . Days of Exercise per Week:   . Minutes of Exercise per Session:   Stress:   . Feeling of Stress :   Social Connections:   . Frequency of Communication with Friends and Family:   . Frequency of Social Gatherings with Friends and Family:   . Attends Religious Services:   . Active Member of Clubs or Organizations:   . Attends Banker Meetings:   Marland Kitchen Marital Status:    Additional Social History:    Allergies:  No Known Allergies  Labs:  Results for orders placed or performed during the hospital encounter of 09/29/19 (from the past 48 hour(s))  Comprehensive metabolic panel     Status: None   Collection Time: 09/29/19  7:06 PM  Result Value Ref Range   Sodium 139 135 - 145 mmol/L   Potassium 3.8 3.5 - 5.1 mmol/L   Chloride 104 98 - 111 mmol/L   CO2 28 22 - 32 mmol/L   Glucose, Bld 89 70 - 99 mg/dL    Comment: Glucose reference range applies only to samples taken after fasting for at least 8 hours.   BUN 19 6 - 20 mg/dL   Creatinine, Ser 0.93 0.61 - 1.24 mg/dL   Calcium 9.3 8.9 - 26.7 mg/dL   Total Protein 6.9 6.5 - 8.1 g/dL   Albumin 4.1 3.5 - 5.0 g/dL   AST 17 15 - 41 U/L   ALT 14 0 - 44 U/L   Alkaline Phosphatase 55 38 - 126 U/L   Total Bilirubin 0.7 0.3 - 1.2 mg/dL    GFR calc non Af Amer >60 >60 mL/min   GFR calc Af Amer >60 >60 mL/min   Anion gap 7 5 - 15    Comment: Performed at [facility], [address], [place], [place] [address]  Ethanol     Status: None   Collection Time: 09/29/19  7:06 PM  Result Value Ref Range   Alcohol, Ethyl (B) <10 <10 mg/dL    Comment: (NOTE) Lowest detectable limit for serum alcohol is 10 mg/dL. For medical purposes only. Performed at [facility], [address], [place], [place] [address]   Salicylate level     Status: Abnormal  Collection Time: 09/29/19  7:06 PM  Result Value Ref Range   Salicylate Lvl <7.0 (L) 7.0 - 30.0 mg/dL    Comment: Performed at [facility], [address] [place], [place] [address]  Acetaminophen level     Status: Abnormal   Collection Time: 09/29/19  7:06 PM  Result Value Ref Range   Acetaminophen (Tylenol), Serum <10 (L) 10 - 30 ug/mL    Comment: (NOTE) Therapeutic concentrations vary significantly. A range of 10-30 ug/mL  may be an effective concentration for many patients. However, some  are best treated at concentrations outside of this range. Acetaminophen concentrations >150 ug/mL at 4 hours after ingestion  and >50 ug/mL at 12 hours after ingestion are often associated with  toxic reactions. Performed at [facility], [address] [place], [place] [address]   cbc     Status: None   Collection Time: 09/29/19  7:06 PM  Result Value Ref Range   WBC 8.3 4.0 - 10.5 K/uL   RBC 4.62 4.22 - 5.81 MIL/uL   Hemoglobin 14.1 13.0 - 17.0 g/dL   HCT 47.4 25.9 - 56.3 %   MCV 88.7 80.0 - 100.0 fL   MCH 30.5 26.0 - 34.0 pg   MCHC 34.4 30.0 - 36.0 g/dL   RDW 87.5 64.3 - 32.9 %   Platelets 151 150 - 400 K/uL   nRBC 0.0 0.0 - 0.2 %    Comment: Performed at [facility], [address] [place], [place] [address]  Urine Drug Screen, Qualitative     Status: Abnormal   Collection Time: 09/29/19  7:07 PM  Result Value Ref Range    Tricyclic, Ur Screen NONE DETECTED NONE DETECTED   Amphetamines, Ur Screen NONE DETECTED NONE DETECTED   MDMA (Ecstasy)Ur Screen NONE DETECTED NONE DETECTED   Cocaine Metabolite,Ur [place] NONE DETECTED NONE DETECTED   Opiate, Ur Screen NONE DETECTED NONE DETECTED   Phencyclidine (PCP) Ur S NONE DETECTED NONE DETECTED   Cannabinoid 50 Ng, Ur [place] POSIT[place] (A) NONE DETECTED   Barbiturates, Ur Screen NONE DETECTED NONE DETECTED   Benzodiazepine, Ur Scrn NONE DETECTED NONE DETECTED   Methadone Scn, Ur NONE DETECTED NONE DETECTED    Comment: (NOTE) Tricyclics + metabolites, urine    Cutoff 1000 ng/mL Amphetamines + metabolites, urine  Cutoff 1000 ng/mL MDMA (Ecstasy), urine              Cutoff 500 ng/mL Cocaine Metabolite, urine          Cutoff 300 ng/mL Opiate + metabolites, urine        Cutoff 300 ng/mL Phencyclidine (PCP), urine         Cutoff 25 ng/mL Cannabinoid, urine                 Cutoff 50 ng/mL Barbiturates + metabolites, urine  Cutoff 200 ng/mL Benzodiazepine, urine              Cutoff 200 ng/mL Methadone, urine                   Cutoff 300 ng/mL The urine drug screen provides only a preliminary, unconfirmed analytical test result and should not be used for non-medical purposes. Clinical consideration and professional judgment should be applied to any positive drug screen result due to possible interfering substances. A more specific alternate chemical method must be used in order to obtain a confirmed analytical result. Gas chromatography / mass spectrometry (GC/MS) is the preferred [employer]  method. Performed at [facility], [address] [place], [place] [address]     No current facility-administered medications for this encounter.   Current Outpatient Medications  Medication Sig Dispense Refill  . levETIRAcetam (KEPPRA) 500 MG tablet Take 1 tablet (500 mg total) by mouth 2 (two) times daily. 60 tablet 0    Musculoskeletal: Strength & Muscle Tone:  within normal limits Gait & Station: normal Patient leans: N/A  Psychiatric Specialty Exam: Physical Exam  Nursing note and vitals reviewed. Constitutional: He is oriented to person, place, and time. He appears well-developed.  HENT:  Head: Normocephalic.  Eyes: Pupils are equal, round, and reactive to light.  Respiratory: Effort normal.  Musculoskeletal:        General: Normal range of motion.     Cervical back: Normal range of motion.  Neurological: He is alert and oriented to person, place, and time.  Skin: Skin is warm and dry.  Psychiatric: He has a normal mood and affect. His speech is normal and behavior is normal. Judgment and thought content normal. Cognition and memory are normal.    Review of Systems  All other systems reviewed and are negative.   Blood pressure (!) 108/58, pulse 68, temperature 98.1 F (36.7 C), temperature source Oral, resp. rate 18, height 6' (1.829 m), weight 73 kg, SpO2 99 %.Body mass index is 21.84 kg/m.  General Appearance: Casual  Eye Contact:  Good  Speech:  Clear and Coherent  Volume:  Normal  Mood:  Euthymic  Affect:  Appropriate  Thought Process:  Coherent and Descriptions of Associations: Intact  Orientation:  Full (Time, Place, and Person)  Thought Content:  WDL  Suicidal Thoughts:  No  Homicidal Thoughts:  No  Memory:  Immediate;   Good  Judgement:  Good  Insight:  Good  Psychomotor Activity:  Normal  Concentration:  Concentration: Good  Recall:  Good  Fund of Knowledge:  Good  Language:  Good  Akathisia:  NA  Handed:  Right  AIMS (if indicated):     Assets:  Communication Skills Desire for Improvement Social Support  ADL's:  Intact  Cognition:  WNL  Sleep:       Disposition: No evidence of imminent risk to self or others at present.   Patient does not meet criteria for psychiatric inpatient admission. Supportive therapy provided about ongoing stressors.  Jearld Lesch, NP 09/29/2019 9:43 PM

## 2019-09-29 NOTE — ED Notes (Signed)
This RN and Kadijah NT changed patient into hospital provided scrubs. Patient's belonging's placed into labeled bag. Patient's belonging's include:   2 leather bracelets, 2 rubber bracelets, 1 cell phone, 2 silver-colored stud earrings with clear stone, black long sleeve shirt, black pants, black socks, brown shoes, underwear, black wallet, charger for ankle bracelet.  Patient retains grey mask, police, ankle bracelet .

## 2019-09-29 NOTE — ED Triage Notes (Signed)
Pt to ED via POV with his brother who states that pt is here because he was court ordered to have psych evaluation. Per pt brother and pt, they do not know why he was supposed to have psych evaluation, pt brother stated "it was either do this or he was going to have to do some time". Pt denies previous mental health hx. Pt denies thoughts of harming himself or anyone else. Pt is in NAD.

## 2020-01-15 ENCOUNTER — Encounter: Payer: Self-pay | Admitting: Emergency Medicine

## 2020-01-15 ENCOUNTER — Other Ambulatory Visit: Payer: Self-pay

## 2020-01-15 ENCOUNTER — Emergency Department: Payer: Self-pay

## 2020-01-15 ENCOUNTER — Emergency Department
Admission: EM | Admit: 2020-01-15 | Discharge: 2020-01-15 | Disposition: A | Discharge status: Home / Self Care | Payer: Self-pay | Attending: Emergency Medicine | Admitting: Emergency Medicine

## 2020-01-15 DIAGNOSIS — Y939 Activity, unspecified: Secondary | ICD-10-CM | POA: Insufficient documentation

## 2020-01-15 DIAGNOSIS — Y929 Unspecified place or not applicable: Secondary | ICD-10-CM | POA: Insufficient documentation

## 2020-01-15 DIAGNOSIS — Y999 Unspecified external cause status: Secondary | ICD-10-CM | POA: Insufficient documentation

## 2020-01-15 DIAGNOSIS — R52 Pain, unspecified: Secondary | ICD-10-CM

## 2020-01-15 DIAGNOSIS — S93401A Sprain of unspecified ligament of right ankle, initial encounter: Secondary | ICD-10-CM | POA: Insufficient documentation

## 2020-01-15 DIAGNOSIS — W19XXXA Unspecified fall, initial encounter: Secondary | ICD-10-CM | POA: Insufficient documentation

## 2020-01-15 DIAGNOSIS — S60221A Contusion of right hand, initial encounter: Secondary | ICD-10-CM | POA: Insufficient documentation

## 2020-01-15 MED ORDER — IBUPROFEN 600 MG PO TABS: 600.0000 mg | ORAL_TABLET | Freq: Three times a day (TID) | ORAL | 0 refills | Status: AC | PRN

## 2020-01-15 MED ORDER — KETOROLAC TROMETHAMINE 60 MG/2ML IM SOLN
30.0000 mg | Freq: Once | INTRAMUSCULAR | Status: AC
  Administered 2020-01-15: 30 mg via INTRAMUSCULAR
  Filled 2020-01-15: qty 2

## 2020-01-15 MED ORDER — HYDROCODONE-ACETAMINOPHEN 5-325 MG PO TABS: 1.0000 | ORAL_TABLET | Freq: Four times a day (QID) | ORAL | 0 refills | Status: DC | PRN

## 2020-01-15 NOTE — ED Notes (Signed)
Patient transported to X-ray 
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
## 2020-01-15 NOTE — ED Notes (Signed)
Pt walked past stat desk, threw his blanket on the floor, and stated "Im outta here".  Walked out the door
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
## 2020-01-15 NOTE — Discharge Instructions (Addendum)
1.  You may take pain medicines as needed (Motrin/Norco #15). 2.  Apply ice to affected area several times daily to reduce swelling. 3.  You may remove wrist splint & Ace bandage as needed. 4.  Return to the ER for worsening symptoms, persistent vomiting, difficulty breathing or other concerns.

## 2020-01-15 NOTE — ED Notes (Signed)
Pt returned to waiting room and now wants to be seen

## 2020-06-25 ENCOUNTER — Other Ambulatory Visit: Payer: Self-pay

## 2020-06-25 ENCOUNTER — Encounter: Payer: Self-pay | Admitting: Emergency Medicine

## 2020-06-25 DIAGNOSIS — Z20822 Contact with and (suspected) exposure to covid-19: Secondary | ICD-10-CM

## 2020-06-27 LAB — SARS-COV-2, NAA 2 DAY TAT

## 2020-06-27 LAB — NOVEL CORONAVIRUS, NAA: SARS-CoV-2, NAA: NOT DETECTED

## 2021-06-15 ENCOUNTER — Other Ambulatory Visit: Payer: Self-pay

## 2021-06-15 ENCOUNTER — Emergency Department
Admission: EM | Admit: 2021-06-15 | Discharge: 2021-06-15 | Disposition: A | Discharge status: Home / Self Care | Payer: Self-pay | Attending: Emergency Medicine | Admitting: Emergency Medicine

## 2021-06-15 ENCOUNTER — Encounter: Payer: Self-pay | Admitting: Emergency Medicine

## 2021-06-15 ENCOUNTER — Emergency Department: Payer: Self-pay

## 2021-06-15 DIAGNOSIS — Y9389 Activity, other specified: Secondary | ICD-10-CM | POA: Insufficient documentation

## 2021-06-15 DIAGNOSIS — W228XXA Striking against or struck by other objects, initial encounter: Secondary | ICD-10-CM | POA: Insufficient documentation

## 2021-06-15 DIAGNOSIS — Y9289 Other specified places as the place of occurrence of the external cause: Secondary | ICD-10-CM | POA: Insufficient documentation

## 2021-06-15 DIAGNOSIS — S60221A Contusion of right hand, initial encounter: Secondary | ICD-10-CM | POA: Insufficient documentation

## 2021-06-15 MED ORDER — ACETAMINOPHEN 500 MG PO TABS
1000.0000 mg | ORAL_TABLET | Freq: Once | ORAL | Status: AC
  Administered 2021-06-15: 1000 mg via ORAL
  Filled 2021-06-15: qty 2

## 2021-06-15 MED ORDER — IBUPROFEN 400 MG PO TABS
400.0000 mg | ORAL_TABLET | Freq: Once | ORAL | Status: AC
  Administered 2021-06-15: 400 mg via ORAL
  Filled 2021-06-15: qty 1
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
## 2021-07-03 ENCOUNTER — Emergency Department
Admission: EM | Admit: 2021-07-03 | Discharge: 2021-07-03 | Disposition: A | Discharge status: Home / Self Care | Payer: No Typology Code available for payment source | Attending: Emergency Medicine | Admitting: Emergency Medicine

## 2021-07-03 ENCOUNTER — Other Ambulatory Visit: Payer: Self-pay

## 2021-07-03 ENCOUNTER — Emergency Department: Payer: No Typology Code available for payment source

## 2021-07-03 DIAGNOSIS — Y92410 Unspecified street and highway as the place of occurrence of the external cause: Secondary | ICD-10-CM | POA: Insufficient documentation

## 2021-07-03 DIAGNOSIS — M25562 Pain in left knee: Secondary | ICD-10-CM | POA: Insufficient documentation

## 2021-07-03 DIAGNOSIS — M25512 Pain in left shoulder: Secondary | ICD-10-CM | POA: Diagnosis not present

## 2021-07-03 MED ORDER — METHOCARBAMOL 500 MG PO TABS: 500.0000 mg | ORAL_TABLET | Freq: Three times a day (TID) | ORAL | 0 refills | Status: AC | PRN

## 2021-07-03 MED ORDER — MELOXICAM 15 MG PO TABS: 15.0000 mg | ORAL_TABLET | Freq: Every day | ORAL | 2 refills | Status: AC

## 2021-07-03 MED ORDER — IBUPROFEN 600 MG PO TABS
600.0000 mg | ORAL_TABLET | Freq: Once | ORAL | Status: AC
  Administered 2021-07-03: 600 mg via ORAL
  Filled 2021-07-03: qty 1
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
## 2021-07-03 NOTE — ED Notes (Signed)
First encounter with pt prior to discharge. Pt verbalized understanding of discharge instructions. Pt did not want vital signs taken and e-signature is not available at this time. Pt advised if symptoms worsen to return to ED>.
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
## 2021-07-03 NOTE — Discharge Instructions (Signed)
You can take meloxicam and Robaxin for pain.

## 2022-05-13 ENCOUNTER — Other Ambulatory Visit: Payer: Self-pay

## 2022-05-13 ENCOUNTER — Emergency Department
Admission: EM | Admit: 2022-05-13 | Discharge: 2022-05-13 | Disposition: A | Discharge status: Home / Self Care | Payer: Self-pay | Attending: Emergency Medicine | Admitting: Emergency Medicine

## 2022-05-13 DIAGNOSIS — K047 Periapical abscess without sinus: Secondary | ICD-10-CM | POA: Insufficient documentation

## 2022-05-13 LAB — BASIC METABOLIC PANEL
Anion gap: 7 (ref 5–15)
BUN: 17 mg/dL (ref 6–20)
CO2: 25 mmol/L (ref 22–32)
Calcium: 9 mg/dL (ref 8.9–10.3)
Chloride: 107 mmol/L (ref 98–111)
Creatinine, Ser: 0.89 mg/dL (ref 0.61–1.24)
GFR, Estimated: >60 mL/min (ref >60)
Glucose, Bld: 88 mg/dL (ref 70–99)
Potassium: 3.9 mmol/L (ref 3.5–5.1)
Sodium: 139 mmol/L (ref 135–145)

## 2022-05-13 LAB — CBC
HCT: 42.9 % (ref 39.0–52.0)
Hemoglobin: 14.2 g/dL (ref 13.0–17.0)
MCH: 30.3 pg (ref 26.0–34.0)
MCHC: 33.1 g/dL (ref 30.0–36.0)
MCV: 91.5 fL (ref 80.0–100.0)
Platelets: 115 10*3/uL — ABNORMAL LOW (ref 150–400)
RBC: 4.69 MIL/uL (ref 4.22–5.81)
RDW: 13.2 % (ref 11.5–15.5)
WBC: 11.4 10*3/uL — ABNORMAL HIGH (ref 4.0–10.5)
nRBC: 0 % (ref 0.0–0.2)

## 2022-05-13 MED ORDER — CLINDAMYCIN HCL 300 MG PO CAPS: 300.0000 mg | ORAL_CAPSULE | Freq: Four times a day (QID) | ORAL | 0 refills | Status: AC

## 2022-05-13 MED ORDER — OXYCODONE-ACETAMINOPHEN 5-325 MG PO TABS
1.0000 | ORAL_TABLET | ORAL | Status: DC | PRN
  Administered 2022-05-13: 1 via ORAL
  Filled 2022-05-13: qty 1

## 2022-05-13 MED ORDER — CLINDAMYCIN HCL 150 MG PO CAPS
300.0000 mg | ORAL_CAPSULE | Freq: Once | ORAL | Status: AC
  Administered 2022-05-13: 300 mg via ORAL
  Filled 2022-05-13: qty 2

## 2022-05-13 MED ORDER — CLINDAMYCIN HCL 300 MG PO CAPS: 300.0000 mg | ORAL_CAPSULE | Freq: Four times a day (QID) | ORAL | 0 refills | Status: DC

## 2022-05-13 NOTE — ED Notes (Signed)
ED Provider at bedside. 

## 2022-05-13 NOTE — ED Provider Notes (Signed)
[facility] Provider Note  Patient Contact: 7:07 PM (approximate)   History   Dental Pain   HPI  Herbert Griffin is a 31 y.o. male who presents emergency complaining of dental infection.  Patient states that he is currently having some dental work done, just needs the infections and care.  Patient has no fevers or chills, difficulty breathing or swallowing.  Patient does have swelling along the left jaw.  No edema, erythema in the submandibular region or anterior neck.     Physical Exam   Triage Vital Signs: ED Triage Vitals  Enc Vitals Group     BP 05/13/22 1727 (!) 148/90     Pulse Rate 05/13/22 1727 60     Resp 05/13/22 1727 18     Temp 05/13/22 1727 99.1 F (37.3 C)     Temp Source 05/13/22 1727 Oral     SpO2 05/13/22 1727 100 %     Weight --      Height --      Head Circumference --      Peak Flow --      Pain Score 05/13/22 1726 10     Pain Loc --      Pain Edu? --      Excl. in GC? --     Most recent vital signs: Vitals:   05/13/22 1727  BP: (!) 148/90  Pulse: 60  Resp: 18  Temp: 99.1 F (37.3 C)  SpO2: 100%     General: Alert and in no acute distress. ENT:      Ears:       Nose: No congestion/rhinnorhea.      Mouth/Throat: Mucous membranes are moist.  Patient is a poor dentition of the left lower dentition without appears to be caries.  There is findings consistent with dental infection.  No appreciable drainable fluid collection in the oral cavity.  Patient does have some associated edema along the left mandible without fluctuance.  There is no extension into the parotid gland region or into the submandibular space. Neck: No stridor. No cervical spine tenderness to palpation.  Cardiovascular:  Good peripheral perfusion Respiratory: Normal respiratory effort without tachypnea or retractions. Lungs CTAB.  Musculoskeletal: Full range of motion to all extremities.  Neurologic:  No gross focal neurologic deficits are  appreciated.  Skin:   No rash noted Other:   ED Results / Procedures / Treatments   Labs (all labs ordered are listed, but only abnormal results are displayed) Labs Reviewed  CBC - Abnormal; Notable for the following components:      Result Value   WBC 11.4 (*)    Platelets 115 (*)    All other components within normal limits  BASIC METABOLIC PANEL     EKG     RADIOLOGY    No results found.  PROCEDURES:  Critical Care performed: No  Procedures   MEDICATIONS ORDERED IN ED: Medications  oxyCODONE-acetaminophen (PERCOCET/ROXICET) 5-325 MG per tablet 1 tablet (1 tablet Oral Given 05/13/22 1729)     IMPRESSION / MDM / ASSESSMENT AND PLAN / ED COURSE  I reviewed the triage vital signs and the nursing notes.                              Differential diagnosis includes, but is not limited to, dental infection, sialoadenitis, parotiditis, Ludwig's angina, Lemierre's  Patient's presentation is most consistent with acute presentation with potential threat  to life or bodily function.   Patient's diagnosis is consistent with dental infection.  Patient presents emergency department with left sided dental pain, and swelling along the left mandible.  Findings are consistent with dental infection.  No indication of face infection requiring imaging.  Patiently placed on antibiotics.  Return cautions discussed with patient.  Follow-up primary care as needed.  Follow-up with dentist as needed..  Patient is given ED precautions to return to the ED for any worsening or new symptoms.        FINAL CLINICAL IMPRESSION(S) / ED DIAGNOSES   Final diagnoses:  Dental infection     Rx / DC Orders   ED Discharge Orders          Ordered    clindamycin (CLEOCIN) 300 MG capsule  4 times daily        05/13/22 2005             Note:  This document was prepared using Dragon voice recognition software and may include unintentional dictation errors.   Lanette Hampshire 05/13/22 2006    Minna Antis, MD 05/13/22 2212

## 2022-05-13 NOTE — ED Triage Notes (Signed)
Pt comes with c/o dental pain for over week now. Pt states it has gotten worse.pt has swelling noted to left side of face.

## 2022-07-08 ENCOUNTER — Other Ambulatory Visit: Payer: Self-pay

## 2022-07-08 ENCOUNTER — Emergency Department
Admission: EM | Admit: 2022-07-08 | Discharge: 2022-07-08 | Disposition: A | Discharge status: Home / Self Care | Payer: Self-pay | Attending: Emergency Medicine | Admitting: Emergency Medicine

## 2022-07-08 DIAGNOSIS — J069 Acute upper respiratory infection, unspecified: Secondary | ICD-10-CM | POA: Insufficient documentation

## 2022-07-08 DIAGNOSIS — Z1152 Encounter for screening for COVID-19: Secondary | ICD-10-CM | POA: Insufficient documentation

## 2022-07-08 LAB — RESP PANEL BY RT-PCR (RSV, FLU A&B, COVID)  RVPGX2
Influenza A by PCR: NEGATIVE
Influenza B by PCR: NEGATIVE
Resp Syncytial Virus by PCR: NEGATIVE
SARS Coronavirus 2 by RT PCR: NEGATIVE

## 2022-07-08 MED ORDER — ONDANSETRON 4 MG PO TBDP: 4.0000 mg | ORAL_TABLET | Freq: Three times a day (TID) | ORAL | 0 refills | Status: AC | PRN

## 2022-07-08 MED ORDER — BENZONATATE 100 MG PO CAPS: 100.0000 mg | ORAL_CAPSULE | Freq: Three times a day (TID) | ORAL | 0 refills | Status: AC | PRN

## 2022-07-08 MED ORDER — ALBUTEROL SULFATE HFA 108 (90 BASE) MCG/ACT IN AERS: 2.0000 | INHALATION_SPRAY | Freq: Four times a day (QID) | RESPIRATORY_TRACT | 2 refills | Status: AC | PRN

## 2022-07-08 NOTE — ED Provider Notes (Signed)
[facility] Provider Note  Patient Contact: 6:19 PM (approximate)   History   Fever   HPI  Herbert Griffin is a 32 y.o. male presents to the emergency department with cough, nasal congestion and chills that started today while at work.  Patient had 1 episode of vomiting today.  No chest pain or chest tightness.      Physical Exam   Triage Vital Signs: ED Triage Vitals [07/08/22 1745]  Enc Vitals Group     BP 132/75     Pulse Rate 90     Resp 18     Temp 97.8 F (36.6 C)     Temp src      SpO2 98 %     Weight      Height      Head Circumference      Peak Flow      Pain Score 9     Pain Loc      Pain Edu?      Excl. in [place]?     Most recent vital signs: Vitals:   07/08/22 1745  BP: 132/75  Pulse: 90  Resp: 18  Temp: 97.8 F (36.6 C)  SpO2: 98%     Constitutional: Alert and oriented. Patient is lying supine. Eyes: Conjunctivae are normal. PERRL. EOMI. Head: Atraumatic. ENT:      Ears: Tympanic membranes are mildly injected with mild effusion bilaterally.       Nose: No congestion/rhinnorhea.      Mouth/Throat: Mucous membranes are moist. Posterior pharynx is mildly erythematous.  Hematological/Lymphatic/Immunilogical: No cervical lymphadenopathy.  Cardiovascular: Normal rate, regular rhythm. Normal S1 and S2.  Good peripheral circulation. Respiratory: Normal respiratory effort without tachypnea or retractions. Lungs CTAB. Good air entry to the bases with no decreased or absent breath sounds. Gastrointestinal: Bowel sounds 4 quadrants. Soft and nontender to palpation. No guarding or rigidity. No palpable masses. No distention. No CVA tenderness. Musculoskeletal: Full range of motion to all extremities. No gross deformities appreciated. Neurologic:  Normal speech and language. No gross focal neurologic deficits are appreciated.  Skin:  Skin is warm, dry and intact. No rash noted. Psychiatric: Mood and affect are normal. Speech and  behavior are normal. Patient exhibits appropriate insight and judgement.   ED Results / Procedures / Treatments   Labs (all labs ordered are listed, but only abnormal results are displayed) Labs Reviewed  RESP PANEL BY RT-PCR (RSV, FLU A&B, COVID)  [id number]       PROCEDURES:  Critical Care performed: No  Procedures   MEDICATIONS ORDERED IN ED: Medications - No data to display   IMPRESSION / MDM / [place] / ED COURSE  I reviewed the triage vital signs and the nursing notes.                             Assessment and plan:  Unspecified viral infection 32 year old male presents to the emergency department with flulike symptoms.  Suspect unspecified viral URI.  Recommended rest, hydration, Tylenol and ibuprofen for fever and bodyaches.  Patient was prescribed albuterol inhaler, Tessalon Perles and Zofran.     FINAL CLINICAL IMPRESSION(S) / ED DIAGNOSES   Final diagnoses:  Viral URI     Rx / DC Orders   ED Discharge Orders          Ordered    benzonatate (TESSALON PERLES) 100 MG capsule  3 times daily PRN  07/08/22 1805    albuterol (VENTOLIN HFA) 108 (90 Base) MCG/ACT inhaler  Every 6 hours PRN        07/08/22 1805    ondansetron (ZOFRAN-ODT) 4 MG disintegrating tablet  Every 8 hours PRN        07/08/22 1805             Note:  This document was prepared using Dragon voice recognition software and may include unintentional dictation errors.   Vallarie Mare [place], PA-C 07/08/22 Tresa Moore    Lavonia Drafts, MD 07/08/22 [phone]

## 2022-07-08 NOTE — ED Triage Notes (Signed)
Pt comes with c/o flu like symptoms. Pt states this started few days ago. Pt states cough and pain in chest when coughing.

## 2022-09-01 ENCOUNTER — Emergency Department: Payer: Self-pay

## 2022-09-01 ENCOUNTER — Other Ambulatory Visit: Payer: Self-pay

## 2022-09-01 ENCOUNTER — Emergency Department
Admission: EM | Admit: 2022-09-01 | Discharge: 2022-09-01 | Disposition: A | Discharge status: Home / Self Care | Payer: Self-pay | Attending: Emergency Medicine | Admitting: Emergency Medicine

## 2022-09-01 ENCOUNTER — Encounter: Payer: Self-pay | Admitting: Emergency Medicine

## 2022-09-01 DIAGNOSIS — Y9389 Activity, other specified: Secondary | ICD-10-CM | POA: Insufficient documentation

## 2022-09-01 DIAGNOSIS — S62525A Nondisplaced fracture of distal phalanx of left thumb, initial encounter for closed fracture: Secondary | ICD-10-CM | POA: Insufficient documentation

## 2022-09-01 DIAGNOSIS — W231XXA Caught, crushed, jammed, or pinched between stationary objects, initial encounter: Secondary | ICD-10-CM | POA: Insufficient documentation

## 2022-09-01 MED ORDER — MELOXICAM 15 MG PO TABS: 15.0000 mg | ORAL_TABLET | Freq: Every day | ORAL | 0 refills | Status: AC

## 2022-09-01 NOTE — Discharge Instructions (Addendum)
Please splint from, avoid any heavy lifting pushing or pulling.  Follow-up with orthopedics if no improvement in 1 week.  Please take Tylenol and meloxicam as needed for pain.

## 2022-09-01 NOTE — ED Notes (Signed)
Provider Arvella Nigh applied splint to pt's thumb earlier. Provider Arvella Nigh educated pt. Pt's resp reg/unlabored, skin dry and calmly sitting on stretcher.

## 2022-09-01 NOTE — ED Triage Notes (Signed)
Patient to ED via POV for left thumb pain. Patient states he smashed it on a forklift while changing a blade. Can move thumb but painful.

## 2022-09-01 NOTE — ED Provider Notes (Signed)
[facility] Provider Note   CSN: [id number] Arrival date & time: 09/01/22  1723     History  Chief Complaint  Patient presents with   Hand Injury    SHMAR Herbert Griffin is a 32 y.o. male.  Presents to the emergency department for evaluation of of left thumb crush injury.  Patient states earlier today a forklift arm came down on his left thumb.  He has had pain and swelling to the left thumb since.  No other injury to his body.  Pain is moderate.  No bleeding underneath the nail.  No nail injury  HPI     Home Medications Prior to Admission medications   Medication Sig Start Date End Date Taking? Authorizing Provider  meloxicam (MOBIC) 15 MG tablet Take 1 tablet (15 mg total) by mouth daily. 09/01/22 09/01/23 Yes Duanne Guess, PA-C  albuterol (VENTOLIN HFA) 108 (90 Base) MCG/ACT inhaler Inhale 2 puffs into the lungs every 6 (six) hours as needed for wheezing or shortness of breath. 07/08/22   Lannie Fields, PA-C  ibuprofen (ADVIL) 600 MG tablet Take 1 tablet (600 mg total) by mouth every 8 (eight) hours as needed. 01/15/20   Paulette Blanch, MD  levETIRAcetam (KEPPRA) 500 MG tablet Take 1 tablet (500 mg total) by mouth 2 (two) times daily. 05/05/18   Fransico Meadow, PA-C      Allergies    Patient has no known allergies.    Review of Systems   Review of Systems  Physical Exam Updated Vital Signs BP 136/80   Pulse 61   Temp 98 F (36.7 C) (Oral)   Resp 18   SpO2 97%  Physical Exam Constitutional:      Appearance: He is well-developed.  HENT:     Head: Normocephalic and atraumatic.  Eyes:     Conjunctiva/sclera: Conjunctivae normal.  Cardiovascular:     Rate and Rhythm: Normal rate.  Pulmonary:     Effort: Pulmonary effort is normal. No respiratory distress.  Musculoskeletal:     Cervical back: Normal range of motion.     Comments: Examination of the left hand shows mild swelling to the distal phalanx of the left thumb with no  subungual hematoma and no nail injury.  Extensor mechanism is intact.  No IP joint tenderness.  Skin:    General: Skin is warm.     Findings: No rash.  Neurological:     Mental Status: He is alert and oriented to person, place, and time.  Psychiatric:        Behavior: Behavior normal.        Thought Content: Thought content normal.     ED Results / Procedures / Treatments   Labs (all labs ordered are listed, but only abnormal results are displayed) Labs Reviewed - No data to display  EKG None  Radiology DG Finger Thumb Left  Result Date: 09/01/2022 CLINICAL DATA:  Injury, left thumb pain. EXAM: LEFT THUMB 2+V COMPARISON:  None Available. FINDINGS: There is no evidence of fracture or dislocation. Normal alignment and joint spaces. There is no evidence of arthropathy or other focal bone abnormality. Soft tissues are unremarkable. IMPRESSION: Negative radiographs of the left thumb. Electronically Signed   By: Keith Rake M.D.   On: 09/01/2022 18:21    Procedures Procedures    Medications Ordered in ED Medications - No data to display  ED Course/ Medical Decision Making/ A&P  Medical Decision Making Amount and/or Complexity of Data Reviewed Radiology: ordered.  Risk Prescription drug management.   32 year old male with left thumb crush injury.  X-rays ordered and independently reviewed by me today show some concern for nondisplaced distal phalanx fracture, transverse with no intra-articular extension.  X-rays were read as negative but there does appear to be signs of a subtle nondisplaced fracture.  Patient treated as if there is a fracture with splint, he will follow-up with orthopedics in 1 week for repeat evaluation.  He is given a prescription for meloxicam to take daily and he is educated on care of thumb splint over the next few weeks.  He understands signs symptoms return to the ER for such as any severe pain, swelling warmth  redness. Final Clinical Impression(s) / ED Diagnoses Final diagnoses:  Closed nondisplaced fracture of distal phalanx of left thumb, initial encounter    Rx / DC Orders ED Discharge Orders          Ordered    meloxicam (MOBIC) 15 MG tablet  Daily        09/01/22 1949              Renata Caprice 09/01/22 Shelbie Proctor, MD 09/01/22 2314

## 2022-11-11 ENCOUNTER — Emergency Department: Payer: Self-pay

## 2022-11-11 ENCOUNTER — Emergency Department
Admission: EM | Admit: 2022-11-11 | Discharge: 2022-11-12 | Disposition: A | Discharge status: Home / Self Care | Payer: Self-pay | Attending: Emergency Medicine | Admitting: Emergency Medicine

## 2022-11-11 ENCOUNTER — Encounter: Payer: Self-pay | Admitting: Emergency Medicine

## 2022-11-11 DIAGNOSIS — M25532 Pain in left wrist: Secondary | ICD-10-CM | POA: Insufficient documentation

## 2022-11-11 DIAGNOSIS — R55 Syncope and collapse: Secondary | ICD-10-CM | POA: Insufficient documentation

## 2022-11-11 LAB — CBC WITH DIFFERENTIAL/PLATELET
Abs Immature Granulocytes: 0.03 10*3/uL (ref 0.00–0.07)
Basophils Absolute: 0 10*3/uL (ref 0.0–0.1)
Basophils Relative: 1 %
Eosinophils Absolute: 0.2 10*3/uL (ref 0.0–0.5)
Eosinophils Relative: 2 %
HCT: 44.3 % (ref 39.0–52.0)
Hemoglobin: 14.6 g/dL (ref 13.0–17.0)
Immature Granulocytes: 0 %
Lymphocytes Relative: 15 %
Lymphs Abs: 1.1 10*3/uL (ref 0.7–4.0)
MCH: 29.7 pg (ref 26.0–34.0)
MCHC: 33 g/dL (ref 30.0–36.0)
MCV: 90.2 fL (ref 80.0–100.0)
Monocytes Absolute: 0.6 10*3/uL (ref 0.1–1.0)
Monocytes Relative: 7 %
Neutro Abs: 5.5 10*3/uL (ref 1.7–7.7)
Neutrophils Relative %: 75 %
Platelets: 143 10*3/uL — ABNORMAL LOW (ref 150–400)
RBC: 4.91 MIL/uL (ref 4.22–5.81)
RDW: 13.4 % (ref 11.5–15.5)
WBC: 7.4 10*3/uL (ref 4.0–10.5)
nRBC: 0 % (ref 0.0–0.2)

## 2022-11-11 LAB — COMPREHENSIVE METABOLIC PANEL
ALT: 21 U/L (ref 0–44)
AST: 20 U/L (ref 15–41)
Albumin: 4.5 g/dL (ref 3.5–5.0)
Alkaline Phosphatase: 61 U/L (ref 38–126)
Anion gap: 7 (ref 5–15)
BUN: 20 mg/dL (ref 6–20)
CO2: 26 mmol/L (ref 22–32)
Calcium: 8.8 mg/dL — ABNORMAL LOW (ref 8.9–10.3)
Chloride: 103 mmol/L (ref 98–111)
Creatinine, Ser: 1.1 mg/dL (ref 0.61–1.24)
GFR, Estimated: >60 mL/min (ref >60)
Glucose, Bld: 118 mg/dL — ABNORMAL HIGH (ref 70–99)
Potassium: 3.5 mmol/L (ref 3.5–5.1)
Sodium: 136 mmol/L (ref 135–145)
Total Bilirubin: 0.7 mg/dL (ref 0.3–1.2)
Total Protein: 7.2 g/dL (ref 6.5–8.1)

## 2022-11-11 LAB — HEPATIC FUNCTION PANEL
ALT: 20 U/L (ref 0–44)
AST: 20 U/L (ref 15–41)
Albumin: 4.1 g/dL (ref 3.5–5.0)
Alkaline Phosphatase: 57 U/L (ref 38–126)
Bilirubin, Direct: <0.1 mg/dL (ref 0.0–0.2)
Total Bilirubin: 0.5 mg/dL (ref 0.3–1.2)
Total Protein: 6.7 g/dL (ref 6.5–8.1)

## 2022-11-11 LAB — LIPASE, BLOOD: Lipase: 42 U/L (ref 11–51)

## 2022-11-11 LAB — TROPONIN I (HIGH SENSITIVITY)
Troponin I (High Sensitivity): <2 ng/L (ref <18)
Troponin I (High Sensitivity): 3 ng/L (ref <18)

## 2022-11-11 MED ORDER — SODIUM CHLORIDE 0.9 % IV BOLUS
1000.0000 mL | Freq: Once | INTRAVENOUS | Status: AC
  Administered 2022-11-11: 1000 mL via INTRAVENOUS

## 2022-11-11 NOTE — ED Provider Notes (Signed)
[facility] Provider Note    Event Date/Time   First MD Initiated Contact with Patient 11/11/22 2039     (approximate)   History   Loss of Consciousness   HPI  Herbert Griffin is a 32 y.o. male  who presents to the emergency department today after possible syncopal episode. Some history is obtained by companion who was witness. States that the patient complained of some abdominal pain and then she noticed his eyes roll back and he fell backwards hitting his head on the ground. He then started having some shaking activity, which lasted about a minute.  He then had some confusion.  Patient says that he had had 1 seizure in the past although attributed that to benzodiazepine withdrawal.  He denies any recent benzo use.  States he think he likely passed out.  Denies any chest pain or palpitations.  Says that he had seen a cardiologist once in the past about a normal EKG but does not recall what they said about it.     Physical Exam   Triage Vital Signs: ED Triage Vitals  Enc Vitals Group     BP 11/11/22 2032 113/79     Pulse Rate 11/11/22 2032 70     Resp 11/11/22 2032 16     Temp 11/11/22 2032 98.5 F (36.9 C)     Temp Source 11/11/22 2032 Oral     SpO2 11/11/22 2032 98 %     Weight 11/11/22 2038 163 lb 9.3 oz (74.2 kg)     Height 11/11/22 2038 6' (1.829 m)     Head Circumference --      Peak Flow --      Pain Score --      Pain Loc --      Pain Edu? --      Excl. in GC? --     Most recent vital signs: Vitals:   11/11/22 2100 11/11/22 2102  BP: 125/75 114/83  Pulse: 78   Resp: 17 12  Temp:    SpO2: 98%    General: Awake, alert, oriented. CV:  Good peripheral perfusion. Regular rate and rhythm. Resp:  Normal effort. Lungs clear. Abd:  No distention. Non tender. Other:  Tenderness to the left wrist without deformity.   ED Results / Procedures / Treatments   Labs (all labs ordered are listed, but only abnormal results are  displayed) Labs Reviewed  COMPREHENSIVE METABOLIC PANEL - Abnormal; Notable for the following components:      Result Value   Glucose, Bld 118 (*)    Calcium 8.8 (*)    All other components within normal limits  CBC WITH DIFFERENTIAL/PLATELET - Abnormal; Notable for the following components:   Platelets 143 (*)    All other components within normal limits  URINALYSIS, ROUTINE W REFLEX MICROSCOPIC  TROPONIN I (HIGH SENSITIVITY)     EKG  I, Phineas Semen, attending physician, personally viewed and interpreted this EKG  EKG Time: 2030 Rate: 67 Rhythm: normal sinus rhythm Axis: normal Intervals: qtc 369 QRS: narrow ST changes: st elevation v3, t wave inversion v4-v6 Impression: abnormal ekg  I, Phineas Semen, attending physician, personally viewed and interpreted this EKG  EKG Time: 2106 Rate: 67 Rhythm: sinus rhythm Axis: normal Intervals: qtc 380 QRS: narrow ST changes: st elevation decreased in v3, t wave inversion v4-v6 Impression: abnormal ekg    RADIOLOGY I independently interpreted and visualized the CXR. My interpretation: No pneumonia Radiology interpretation:  IMPRESSION:  No active disease.    I independently interpreted and visualized the CT head/cervical spine. My interpretation: No intracranial bleed. No acute fracture Radiology interpretation:  IMPRESSION:  CT of the head: No acute intracranial abnormality noted.    CT of the cervical spine: Mild straightening of the normal cervical  lordosis likely related to muscular spasm. No other focal  abnormality is noted.   I independently interpreted and visualized the left wrist. My interpretation: No fracture Radiology interpretation:  IMPRESSION:  Negative.     PROCEDURES:  Critical Care performed: No   MEDICATIONS ORDERED IN ED: Medications - No data to display   IMPRESSION / MDM / ASSESSMENT AND PLAN / ED COURSE  I reviewed the triage vital signs and the nursing notes.                               Differential diagnosis includes, but is not limited to, ICH, fracture, electrolyte abnormality, epilepsy, syncope  Patient's presentation is most consistent with acute presentation with potential threat to life or bodily function.   The patient is on the cardiac monitor to evaluate for evidence of arrhythmia and/or significant heart rate changes.   Patient presents to the emergency department today because of concern for possible syncopal/seizure like episode. Given description of the event do wonder if syncope is more likely.  EKG did show some abnormalities however in review of previous EKGs he has had some abnormalities in the past.  Did repeat an EKG which in fact looked improved.  Initial troponin was negative.  Blood work without concerning electrolyte abnormality, no significant anemia or leukocytosis.  Given the patient was complained of some abdominal pain LFTs and lipase were ordered to be added onto blood work.  Head CT and cervical spine CT without acute fracture or intracranial abnormality.  Did obtain a left wrist x-ray given tenderness and pain, this was fortunately negative.  Awaiting lipase LFTs and repeat troponin at time of signout.  If further testing remains reassuring and patient continues to feel improved I think would be reasonable for patient be discharged.  Do think he would benefit from neurology and cardiology follow-up.     FINAL CLINICAL IMPRESSION(S) / ED DIAGNOSES   Final diagnoses:  Syncope, unspecified syncope type      Note:  This document was prepared using Dragon voice recognition software and may include unintentional dictation errors.    Phineas Semen, MD 11/11/22 [phone]

## 2022-11-11 NOTE — ED Triage Notes (Signed)
Pt presents ambulatory to triage via POV with complaints of syncopal episode this evening. Pt notes feeling nauseated and dizzy before collapsing. He endorses hitting his head, unsure if he had LOC but "doesn't remember much". Endorses some L arm pain following the fall. A&Ox4 at this time. Denies CP or SOB.

## 2022-11-12 NOTE — ED Notes (Signed)
Patient discharged at this time. Ambulated to lobby with independent and steady gait. Breathing unlabored speaking in full sentences. Verbalized understanding of all discharge, follow up, and medication teaching. Discharged homed with all belongings.   

## 2022-11-13 ENCOUNTER — Emergency Department: Payer: Self-pay

## 2022-11-13 ENCOUNTER — Emergency Department
Admission: EM | Admit: 2022-11-13 | Discharge: 2022-11-13 | Disposition: A | Discharge status: Home / Self Care | Payer: Self-pay | Attending: Emergency Medicine | Admitting: Emergency Medicine

## 2022-11-13 ENCOUNTER — Other Ambulatory Visit: Payer: Self-pay

## 2022-11-13 DIAGNOSIS — R202 Paresthesia of skin: Secondary | ICD-10-CM | POA: Insufficient documentation

## 2022-11-13 DIAGNOSIS — M25522 Pain in left elbow: Secondary | ICD-10-CM | POA: Insufficient documentation

## 2022-11-13 NOTE — Discharge Instructions (Signed)
Your x-ray is normal.  Please rest, ice, elevate your arm.  You may take Tylenol and ibuprofen per package instructions to help with your symptoms.  Please return for any new, worsening, or change in symptoms or other concerns.

## 2022-11-13 NOTE — ED Triage Notes (Signed)
Pt presents to ED with c/o of L elbow pain. NAD noted.

## 2022-11-13 NOTE — ED Provider Notes (Signed)
[facility] Provider Note    Event Date/Time   First MD Initiated Contact with Patient 11/13/22 1242     (approximate)   History   Elbow Injury   HPI  Herbert Griffin is a 32 y.o. male who presents today for evaluation of elbow pain.  Patient reports that he syncopized 2 days ago seen in the emergency department.  He reports that he did not report at that time that he had struck his elbow, and he is having paresthesias to his fourth and fifth finger.  He has not had any weakness in his hand or arm.  He has not had any open wounds or swelling.  No fevers or chills.  He is actively eating chips and cookies in the room.  Patient Active Problem List   Diagnosis Date Noted   Psychiatric diagnosis 09/29/2019   Psychiatric exam requested by authority 09/29/2019   Elevated TSH 11/23/2017   Elevated total protein 11/23/2017   Leukopenia 11/16/2017   Thrombocytopenia (HCC) 11/16/2017   Substance induced mood disorder (HCC) 08/27/2016   Benzodiazepine abuse (HCC) 08/27/2016          Physical Exam   Triage Vital Signs: ED Triage Vitals  Enc Vitals Group     BP 11/13/22 1227 103/66     Pulse Rate 11/13/22 1227 84     Resp 11/13/22 1227 20     Temp 11/13/22 1227 97.7 F (36.5 C)     Temp src --      SpO2 11/13/22 1227 100 %     Weight --      Height --      Head Circumference --      Peak Flow --      Pain Score 11/13/22 1226 6     Pain Loc --      Pain Edu? --      Excl. in GC? --     Most recent vital signs: Vitals:   11/13/22 1227  BP: 103/66  Pulse: 84  Resp: 20  Temp: 97.7 F (36.5 C)  SpO2: 100%    Physical Exam Vitals and nursing note reviewed.  Constitutional:      General: Awake and alert. No acute distress.    Appearance: Normal appearance. The patient is normal weight.  HENT:     Head: Normocephalic and atraumatic.     Mouth: Mucous membranes are moist.  Eyes:     General: PERRL. Normal EOMs        Right eye: No  discharge.        Left eye: No discharge.     Conjunctiva/sclera: Conjunctivae normal.  Cardiovascular:     Rate and Rhythm: Normal rate and regular rhythm.     Pulses: Normal pulses.  Pulmonary:     Effort: Pulmonary effort is normal. No respiratory distress.     Breath sounds: Normal breath sounds.  Abdominal:     Abdomen is soft. There is no abdominal tenderness. No rebound or guarding. No distention. Musculoskeletal:        General: No swelling. Normal range of motion.     Cervical back: Normal range of motion and neck supple.  Left upper extremity: Full normal range of motion of the shoulder, elbow, wrist.  Normal intrinsic muscle function of the hand, grip strength is normal.  Sensation tact light touch throughout arm and fingers.  Normal radial pulse.  Normal resisted pronation and supination.  No obvious deformities noted. Skin:  General: Skin is warm and dry.     Capillary Refill: Capillary refill takes less than 2 seconds.     Findings: No rash.  Neurological:     Mental Status: The patient is awake and alert.      ED Results / Procedures / Treatments   Labs (all labs ordered are listed, but only abnormal results are displayed) Labs Reviewed - No data to display   EKG     RADIOLOGY I independently reviewed and interpreted imaging and agree with radiologists findings.     PROCEDURES:  Critical Care performed:   Procedures   MEDICATIONS ORDERED IN ED: Medications - No data to display   IMPRESSION / MDM / ASSESSMENT AND PLAN / ED COURSE  I reviewed the triage vital signs and the nursing notes.   Differential diagnosis includes, but is not limited to, ulnar nerve injury, ulnar nerve entrapment, elbow contusion, fracture.  Patient is awake and alert, hemodynamically stable and afebrile.  He is nontoxic in appearance.  He is actively eating chips and cookies in the room with his friends.  He has normal strength and sensation throughout his upper  extremity.  He has normal grip strength.  He has normal intrinsic muscle function of the hand.  He has 2+ radial pulse.  X-ray obtained is negative for any acute bony findings.  His intermittent paresthesias in his fourth and fifth fingers are suggestive of an ulnar nerve injury or contusion from his recent fall.  His nerve function is not compromised, he is still full normal range of motion of his fingers.  We discussed symptomatic management and return precautions.  Patient or stands and agrees with plan.  He was discharged in stable condition.   Patient's presentation is most consistent with acute complicated illness / injury requiring diagnostic workup.      FINAL CLINICAL IMPRESSION(S) / ED DIAGNOSES   Final diagnoses:  Left elbow pain     Rx / DC Orders   ED Discharge Orders     None        Note:  This document was prepared using Dragon voice recognition software and may include unintentional dictation errors.   Jackelyn Hoehn, PA-C 11/13/22 1347    Corena Herter, MD 11/13/22 [phone]

## 2023-03-21 ENCOUNTER — Other Ambulatory Visit: Payer: Self-pay

## 2023-03-21 DIAGNOSIS — Z1152 Encounter for screening for COVID-19: Secondary | ICD-10-CM | POA: Insufficient documentation

## 2023-03-21 DIAGNOSIS — R1110 Vomiting, unspecified: Secondary | ICD-10-CM | POA: Insufficient documentation

## 2023-03-21 DIAGNOSIS — R1084 Generalized abdominal pain: Secondary | ICD-10-CM | POA: Insufficient documentation

## 2023-03-21 DIAGNOSIS — R197 Diarrhea, unspecified: Secondary | ICD-10-CM | POA: Insufficient documentation

## 2023-03-21 DIAGNOSIS — Z5321 Procedure and treatment not carried out due to patient leaving prior to being seen by health care provider: Secondary | ICD-10-CM | POA: Insufficient documentation

## 2023-03-21 LAB — COMPREHENSIVE METABOLIC PANEL
ALT: 31 U/L (ref 0–44)
AST: 34 U/L (ref 15–41)
Albumin: 4.7 g/dL (ref 3.5–5.0)
Alkaline Phosphatase: 65 U/L (ref 38–126)
Anion gap: 14 (ref 5–15)
BUN: 22 mg/dL — ABNORMAL HIGH (ref 6–20)
CO2: 24 mmol/L (ref 22–32)
Calcium: 9.4 mg/dL (ref 8.9–10.3)
Chloride: 99 mmol/L (ref 98–111)
Creatinine, Ser: 1.15 mg/dL (ref 0.61–1.24)
GFR, Estimated: >60 mL/min (ref >60)
Glucose, Bld: 110 mg/dL — ABNORMAL HIGH (ref 70–99)
Potassium: 3.8 mmol/L (ref 3.5–5.1)
Sodium: 137 mmol/L (ref 135–145)
Total Bilirubin: 0.6 mg/dL (ref 0.3–1.2)
Total Protein: 8.3 g/dL — ABNORMAL HIGH (ref 6.5–8.1)

## 2023-03-21 LAB — URINALYSIS, ROUTINE W REFLEX MICROSCOPIC
Bacteria, UA: NONE SEEN
Bilirubin Urine: NEGATIVE
Glucose, UA: NEGATIVE mg/dL
Ketones, ur: NEGATIVE mg/dL
Leukocytes,Ua: NEGATIVE
Nitrite: NEGATIVE
Protein, ur: 30 mg/dL — AB
Specific Gravity, Urine: 1.028 (ref 1.005–1.030)
pH: 5 (ref 5.0–8.0)

## 2023-03-21 LAB — CBC
HCT: 44 % (ref 39.0–52.0)
Hemoglobin: 15.5 g/dL (ref 13.0–17.0)
MCH: 30.4 pg (ref 26.0–34.0)
MCHC: 35.2 g/dL (ref 30.0–36.0)
MCV: 86.3 fL (ref 80.0–100.0)
Platelets: 147 10*3/uL — ABNORMAL LOW (ref 150–400)
RBC: 5.1 MIL/uL (ref 4.22–5.81)
RDW: 13.2 % (ref 11.5–15.5)
WBC: 6.5 10*3/uL (ref 4.0–10.5)
nRBC: 0 % (ref 0.0–0.2)

## 2023-03-21 LAB — RESP PANEL BY RT-PCR (RSV, FLU A&B, COVID)  RVPGX2
Influenza A by PCR: NEGATIVE
Influenza B by PCR: NEGATIVE
Resp Syncytial Virus by PCR: NEGATIVE
SARS Coronavirus 2 by RT PCR: NEGATIVE

## 2023-03-21 LAB — LIPASE, BLOOD: Lipase: 25 U/L (ref 11–51)

## 2023-03-21 NOTE — ED Triage Notes (Signed)
Pt reports generalized abdominal pain, diarrhea and emesis for the last day. Pt reports girlfriend diagnosed with a stomach virus. Pt reports unable to tolerate PO at home.

## 2023-03-22 ENCOUNTER — Emergency Department
Admission: EM | Admit: 2023-03-22 | Discharge: 2023-03-22 | Discharge status: Against Medical Advice | Payer: Self-pay | Attending: Emergency Medicine | Admitting: Emergency Medicine

## 2023-03-22 NOTE — ED Notes (Signed)
Pt not seen in room. Registration states they saw him walking out of Pod D.

## 2023-08-03 ENCOUNTER — Ambulatory Visit: Payer: Self-pay | Admitting: Family Medicine

## 2023-08-03 DIAGNOSIS — Z113 Encounter for screening for infections with a predominantly sexual mode of transmission: Secondary | ICD-10-CM

## 2023-08-03 NOTE — Patient Instructions (Addendum)
 STI screening - Today we obtained a vaginal swab to screen for gonorrhea, chlamydia, and trichomonas - We also obtained a blood sample to screen for HIV and syphilis - If the results are normal, I will send you a letter or MyChart message. If the results are abnormal, I will give you a call.    Estimated time frame for results collected at the [facility] Department: Same day Trichomonas Yeast BV (bacterial vaginosis)  Within 2 weeks Gonorrhea Chlamydia  Within 3-4 weeks HIV Syphilis Hepatitis B Hepatitis C    Therapy You can use the following website to start looking for a therapist. Remember, finding a therapist you click with it a lot like speed dating - you have to sort through a bunch of lemons before finding what you like!  https://www.psychologytoday.com/us Search for [place], [place] (or city of your choice).  On the next page, you will see filter options at the top.  You may filter therapists by insurance they take, issues you would like to work on, or therapy types.   https://somethings.com/  Smoking cessation resources Quit Smoking Hotline:  800-QUIT-NOW 956 887 0852)

## 2023-08-03 NOTE — Progress Notes (Signed)
Pt is here for std screening, condoms given.  Gaspar Garbe, RN

## 2023-08-03 NOTE — Progress Notes (Signed)
[facility] Department STI clinic [address] [place] [address] Main phone: [phone]  STI screening visit  Subjective:  ABBIE Griffin is a 33 y.o. male being seen today for an STI screening visit. The patient reports they do have symptoms.    Patient has the following medical conditions:  Patient Active Problem List   Diagnosis Date Noted   Psychiatric diagnosis 09/29/2019   Psychiatric exam requested by authority 09/29/2019   Elevated TSH 11/23/2017   Elevated total protein 11/23/2017   Leukopenia 11/16/2017   Thrombocytopenia (HCC) 11/16/2017   Substance induced mood disorder (HCC) 08/27/2016   Benzodiazepine abuse (HCC) 08/27/2016   Chief Complaint  Patient presents with   SEXUALLY TRANSMITTED DISEASE    Burning with urination    HPI Patient reports some burning with urination x 1 week. Found out his partner was cheating over the weekend. No concern for specific exposure, but has cut off contact with ex-partner and has not asked. Would like to have full STI screening.   STI screening history: Last HIV test per patient/review of record was No results found for: "HMHIVSCREEN"  Lab Results  Component Value Date   HIV Non Reactive 11/16/2017   Last HEPC test per patient/review of record was No results found for: "HMHEPCSCREEN" No components found for: "HEPC"   Last HEPB test per patient/review of record was No components found for: "HMHEPBSCREEN"   Fertility: Does the patient or their partner desires a pregnancy in the next year? Yes  Screening for MPX risk: Does the patient have an unexplained rash? Yes Is the patient MSM? No Does the patient endorse multiple sex partners or anonymous sex partners? No Did the patient have close or sexual contact with a person diagnosed with MPX? No Has the patient traveled outside the [place] where MPX is endemic? No Is there a high clinical suspicion for MPX-- evidenced by one of the  following No  -Unlikely to be chickenpox  -Lymphadenopathy  -Rash that present in same phase of evolution on any given body part  See flowsheet for further details and programmatic requirements.   Immunization History  Administered Date(s) Administered   Tdap 08/13/2016, 10/01/2017    The following portions of the patient's history were reviewed and updated as appropriate: allergies, current medications, past medical history, past social history, past surgical history and problem list.  Objective:  There were no vitals filed for this visit.  Physical Exam Vitals and nursing note reviewed.  Constitutional:      Appearance: Normal appearance.  HENT:     Head: Normocephalic and atraumatic.     Mouth/Throat:     Mouth: Mucous membranes are moist.     Pharynx: No oropharyngeal exudate or posterior oropharyngeal erythema.  Eyes:     General: No scleral icterus.       Right eye: No discharge.        Left eye: No discharge.     Conjunctiva/sclera: Conjunctivae normal.     Right eye: Right conjunctiva is not injected. No exudate.    Left eye: Left conjunctiva is not injected. No exudate. Pulmonary:     Effort: Pulmonary effort is normal.  Abdominal:     Palpations: There is no hepatomegaly.  Genitourinary:    Comments: Declined genital exam- asymptomatic Musculoskeletal:     Cervical back: Neck supple. No rigidity or tenderness.  Lymphadenopathy:     Cervical: No cervical adenopathy.     Upper Body:  Right upper body: No supraclavicular or axillary adenopathy.     Left upper body: No supraclavicular or axillary adenopathy.  Skin:    General: Skin is warm and dry.     Capillary Refill: Capillary refill takes less than 2 seconds.  Neurological:     General: No focal deficit present.     Mental Status: He is alert and oriented to person, place, and time.  Psychiatric:        Mood and Affect: Mood normal.        Behavior: Behavior normal.    Assessment and Plan:  Herbert Griffin is a 33 y.o. male presenting to the [facility] Department for STI screening  Screening examination for venereal disease -     Chlamydia/GC NAA, Confirmation -     Syphilis Serology, [facility] -     HIV/HCV [facility] -     HBV Antigen/Antibody State Lab   Patient does have STI symptoms Patient accepted all screenings including  urine GC/Chlamydia, and blood work for HIV/Syphilis. Patient meets criteria for HepB screening? Yes. Ordered? yes Patient meets criteria for HepC screening? Yes. Ordered? yes Recommended condom use with all sex Discussed importance of condom use for STI prevention  Treat positive test results per standing order. Discussed time line for State Lab results and that patient will be called with positive results and encouraged patient to call if he had not heard in 2 weeks Recommended repeat testing in 3 months with positive results. Recommended returning for continued or worsening symptoms.   No follow-ups on file.  No future appointments.  Clydene Fake, MD

## 2023-08-03 NOTE — Addendum Note (Signed)
Addended by: Berdie Ogren on: 08/03/2023 05:09 PM   Modules accepted: Orders

## 2023-08-04 LAB — HEPATITIS B SURFACE ANTIGEN: Hepatitis B Surface Ag: NEGATIVE

## 2023-08-04 LAB — HCV AB W REFLEX TO QUANT PCR: HCV Ab: NONREACTIVE

## 2023-08-04 LAB — HEPATITIS B SURFACE ANTIBODY,QUALITATIVE: Hep B Surface Ab, Qual: REACTIVE

## 2023-08-04 LAB — HCV INTERPRETATION

## 2023-08-05 LAB — HEPATITIS B CORE ANTIBODY, TOTAL: Hep B Core Total Ab: NEGATIVE

## 2023-08-05 LAB — RPR: RPR Ser Ql: NONREACTIVE

## 2023-08-05 LAB — HIV-1/HIV-2 QUALITATIVE RNA
HIV-1 RNA, Qualitative: NONREACTIVE
HIV-2 RNA, Qualitative: NONREACTIVE

## 2023-08-09 ENCOUNTER — Telehealth: Payer: Self-pay

## 2023-08-09 LAB — CHLAMYDIA/GC NAA, CONFIRMATION
Chlamydia trachomatis, NAA: POSITIVE — AB
Neisseria gonorrhoeae, NAA: NEGATIVE

## 2023-08-09 LAB — C. TRACHOMATIS NAA, CONFIRM: C. trachomatis NAA, Confirm: POSITIVE — AB

## 2023-08-09 NOTE — Telephone Encounter (Signed)
-----   Message from Nurse Nadean Corwin sent at 08/09/2023  3:07 PM EST -----  ----- Message ----- From: Nell Range Lab Results In Sent: 08/04/2023   7:36 AM EST To: Achd-Results

## 2023-08-09 NOTE — Telephone Encounter (Signed)
 VM is full, unable to leave a message.  Berdie Ogren, RN

## 2023-08-12 ENCOUNTER — Telehealth: Payer: Self-pay

## 2023-08-12 NOTE — Telephone Encounter (Signed)
 PW verified.  Pt notified of positive Chlamydia results.  Treatment scheduled for 08/13/2023.  Berdie Ogren, RN

## 2023-08-13 ENCOUNTER — Ambulatory Visit: Payer: Self-pay

## 2023-08-13 DIAGNOSIS — A749 Chlamydial infection, unspecified: Secondary | ICD-10-CM

## 2023-08-13 MED ORDER — DOXYCYCLINE HYCLATE 100 MG PO TABS
100.0000 mg | ORAL_TABLET | Freq: Two times a day (BID) | ORAL | Status: AC
Start: 2023-08-13 — End: 2023-08-20

## 2023-08-13 NOTE — Progress Notes (Signed)
 Pt is here for Chlamydia treatment.  The patient was dispensed Doxycycline 100 mg #14 today. I provided counseling today regarding the medication. We discussed the medication, the side effects and when to call clinic. Patient given the opportunity to ask questions. Questions answered.  Condoms given.  Berdie Ogren, RN

## 2024-02-08 ENCOUNTER — Emergency Department: Payer: Self-pay

## 2024-02-08 ENCOUNTER — Encounter: Payer: Self-pay | Admitting: *Deleted

## 2024-02-08 ENCOUNTER — Other Ambulatory Visit: Payer: Self-pay

## 2024-02-08 ENCOUNTER — Emergency Department
Admission: EM | Admit: 2024-02-08 | Discharge: 2024-02-08 | Disposition: A | Discharge status: Home / Self Care | Payer: Self-pay | Attending: Emergency Medicine | Admitting: Emergency Medicine

## 2024-02-08 DIAGNOSIS — S63501A Unspecified sprain of right wrist, initial encounter: Secondary | ICD-10-CM | POA: Insufficient documentation

## 2024-02-08 DIAGNOSIS — Y990 Civilian activity done for income or pay: Secondary | ICD-10-CM | POA: Insufficient documentation

## 2024-02-08 DIAGNOSIS — W208XXA Other cause of strike by thrown, projected or falling object, initial encounter: Secondary | ICD-10-CM | POA: Insufficient documentation

## 2024-02-08 MED ORDER — IBUPROFEN 600 MG PO TABS: 600.0000 mg | ORAL_TABLET | Freq: Once | ORAL | Status: DC

## 2024-02-08 MED ORDER — ACETAMINOPHEN 325 MG PO TABS
650.0000 mg | ORAL_TABLET | Freq: Once | ORAL | Status: AC
  Administered 2024-02-08: 650 mg via ORAL
  Filled 2024-02-08: qty 2

## 2024-02-08 NOTE — ED Provider Notes (Signed)
 [facility] Provider Note    Event Date/Time   First MD Initiated Contact with Patient 02/08/24 1946     (approximate)   History   Wrist Pain   HPI  Herbert Griffin is a 33 y.o. male  with a past medical history of substance-induced mood disorder, benzodiazepine abuse, thrombocytopenia presents to the emergency department with right wrist pain after an injury at work today.  Patient states a pallet fell on his right wrist and he is having some pain in his lateral aspect.  Patient denies fall, fever, chills, numbness or tingling.  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      Physical Exam   Triage Vital Signs: ED Triage Vitals  Encounter Vitals Group     BP 02/08/24 1913 116/78     Girls Systolic BP Percentile --      Girls Diastolic BP Percentile --      Boys Systolic BP Percentile --      Boys Diastolic BP Percentile --      Pulse Rate 02/08/24 1913 61     Resp 02/08/24 1913 16     Temp 02/08/24 1913 98.6 F (37 C)     Temp src --      SpO2 02/08/24 1913 97 %     Weight --      Height --      Head Circumference --      Peak Flow --      Pain Score 02/08/24 1912 10     Pain Loc --      Pain Education --      Exclude from Growth Chart --     Most recent vital signs: Vitals:   02/08/24 1913  BP: 116/78  Pulse: 61  Resp: 16  Temp: 98.6 F (37 C)  SpO2: 97%    General: Awake, in no acute distress. Appears stated age. Head: Normocephalic, atraumatic. CV: Good peripheral perfusion. No edema.  Respiratory:Normal respiratory effort.  No respiratory distress.  GI: Soft, non-distended. MSK: Normal ROM and 5/5 strength in b/l wrists and fingers. No obvious bony abnormalities. Skin: Warm, dry, intact. Mild swelling on dorsal aspect of wrist, TTP on lateral left wrist at distal radius.  Neurological: A&Ox4 to person, place, time, and situation. Neurovascularly intact.  ED Results / Procedures / Treatments   Labs (all labs ordered are  listed, but only abnormal results are displayed) Labs Reviewed - No data to display   EKG     RADIOLOGY X ray right wrist IMPRESSION: Dorsal soft tissue swelling. No acute displaced fractures identified.   PROCEDURES:  Critical Care performed: No   Procedures   MEDICATIONS ORDERED IN ED: Medications  acetaminophen  (TYLENOL ) tablet 650 mg (650 mg Oral Given 02/08/24 2040)     IMPRESSION / MDM / ASSESSMENT AND PLAN / ED COURSE  I reviewed the triage vital signs and the nursing notes.                              Differential diagnosis includes, but is not limited to, wrist sprain, wrist contusion, distal radial fracture  Patient's presentation is most consistent with acute complicated illness / injury requiring diagnostic workup.  Patient is a 33 year old male who presented with right radial wrist pain after an injury that occurred at work today.  X-ray was ordered and shows mild dorsal soft tissue swelling, no acute fractures.  No extensive swelling concerning  for compartment syndrome.  I personally viewed and interpreted the x-ray and agree with the radiologist's report.  Patient provided with a dose of Tylenol  and wrist brace.  Discussed RICE and Tylenol  use at home since he has a history of thrombocytopenia.  Ambulatory referral to primary care provided.  Work note provided.  The patient may return to the emergency department for any new, worsening, or concerning symptoms. Patient was given the opportunity to ask questions; all questions were answered. Emergency department return precautions were discussed with the patient.  Patient is in agreement to the treatment plan.  Patient is stable for discharge.   FINAL CLINICAL IMPRESSION(S) / ED DIAGNOSES   Final diagnoses:  Sprain of right wrist, initial encounter     Rx / DC Orders   ED Discharge Orders          Ordered    Ambulatory Referral to Primary Care (Establish Care)        02/08/24 2032              Note:  This document was prepared using Dragon voice recognition software and may include unintentional dictation errors.     Sheron Salm, PA-C 02/08/24 2153    Clarine Ozell LABOR, MD 02/09/24 SHARLYNE

## 2024-02-08 NOTE — ED Triage Notes (Signed)
 Pt says that a pallet fell on his right wrist at work today. Pain in the right lateral wrist.

## 2024-02-08 NOTE — Discharge Instructions (Addendum)
 You were seen in the emergency department for a right wrist sprain or bruise.  Please take Tylenol  as needed for your pain based on the instructions on the bottle.  Please read through the information provided that discusses ice, heat use as well as elevation of the affected area.  You may wear the brace as needed to help with your pain and at work.  You may return to the emergency department for any new, worsening or concerning symptoms.  I have also made a referral to you to primary care to establish continuity of care.  Please give them a call at your earliest convenience.

## 2024-03-27 ENCOUNTER — Other Ambulatory Visit: Payer: Self-pay

## 2024-03-27 ENCOUNTER — Emergency Department
Admission: EM | Admit: 2024-03-27 | Discharge: 2024-03-27 | Disposition: A | Discharge status: Home / Self Care | Payer: Self-pay | Attending: Emergency Medicine | Admitting: Emergency Medicine

## 2024-03-27 DIAGNOSIS — J0100 Acute maxillary sinusitis, unspecified: Secondary | ICD-10-CM | POA: Insufficient documentation

## 2024-03-27 LAB — RESP PANEL BY RT-PCR (RSV, FLU A&B, COVID)  RVPGX2
Influenza A by PCR: NEGATIVE
Influenza B by PCR: NEGATIVE
Resp Syncytial Virus by PCR: NEGATIVE
SARS Coronavirus 2 by RT PCR: NEGATIVE

## 2024-03-27 LAB — GROUP A STREP BY PCR: Group A Strep by PCR: NOT DETECTED

## 2024-03-27 MED ORDER — AMOXICILLIN-POT CLAVULANATE 875-125 MG PO TABS: 1.0000 | ORAL_TABLET | Freq: Two times a day (BID) | ORAL | 0 refills | Status: AC

## 2024-03-27 NOTE — ED Notes (Signed)
 See triage note  Present with low grade fever and body aches  states sxs' started on Friday  Congestion and slight cough

## 2024-03-27 NOTE — Discharge Instructions (Addendum)
 You have been diagnosed with sinusitis, please take Augmentin 1 tablet by mouth every 12 hours after main meals for 7 days.  You can do sinus rinse in the mornings and nights.  You can take DayQuil every 12 hours for nasal congestion.  Please come back to ED or go to your PCP if you have new symptoms or symptoms worsen. It was a pleasure to help you today.  Lorane Sar, [place]

## 2024-03-27 NOTE — ED Provider Notes (Signed)
 [facility] Provider Note    Event Date/Time   First MD Initiated Contact with Patient 03/27/24 1823     (approximate)   History   Fever    HPI  Herbert Griffin is a 33 y.o. male    with a past medical history of sprain of right wrist, left elbow pain, fracture of left forearm, dysphoric mood, seizures, laceration in the scalp, polysubstance abuse, brought,who presents to the ED complaining of cough, congestion. According to the patient, symptoms started on Friday, but today he is feeling worse.  Symptoms are consistent of facial pain that increased when he is bending, nasal congestion, with cough, body aches, fever, asthenia, and adynamia.  Patient denies chest pain, abdominal pain, diarrhea or urinary symptoms.  Patient denies sick contacts at home or at work.  Patient is here by himself.     Patient Active Problem List   Diagnosis Date Noted   Psychiatric diagnosis 09/29/2019   Psychiatric exam requested by authority 09/29/2019   Elevated TSH 11/23/2017   Elevated total protein 11/23/2017   Leukopenia 11/16/2017   Thrombocytopenia 11/16/2017   Substance induced mood disorder (HCC) 08/27/2016   Benzodiazepine abuse (HCC) 08/27/2016     ROS: Patient currently denies any vision changes, tinnitus, difficulty speaking, facial droop, sore throat, chest pain, shortness of breath, abdominal pain, nausea/vomiting/diarrhea, dysuria, or weakness/numbness/paresthesias in any extremity   Physical Exam   Triage Vital Signs: ED Triage Vitals  Encounter Vitals Group     BP 03/27/24 1729 125/66     Girls Systolic BP Percentile --      Girls Diastolic BP Percentile --      Boys Systolic BP Percentile --      Boys Diastolic BP Percentile --      Pulse Rate 03/27/24 1729 88     Resp 03/27/24 1729 18     Temp 03/27/24 1729 99.1 F (37.3 C)     Temp src --      SpO2 03/27/24 1729 99 %     Weight 03/27/24 1728 165 lb (74.8 kg)     Height 03/27/24 1728 6' 1  (1.854 m)     Head Circumference --      Peak Flow --      Pain Score 03/27/24 1728 10     Pain Loc --      Pain Education --      Exclude from Growth Chart --     Most recent vital signs: Vitals:   03/27/24 1729  BP: 125/66  Pulse: 88  Resp: 18  Temp: 99.1 F (37.3 C)  SpO2: 99%     Physical Exam Vitals and nursing note reviewed.  During triage vital signs were normal  General:          Awake, no distress.  Nasal congestion Face: Tenderness to palpation in frontal and maxillary sinuses.  Tenderness to palpation in preauricular left area of the face. Bilateral otoscopy within normal limits. Mouth: Peritonsillar erythema, tonsillar enlargement, postnasal drip. CV:                  Good peripheral perfusion.  Resp:               Normal effort. no tachypnea, no wheezing Abd:                 No distention.  Soft nontender Other:               ED  Results / Procedures / Treatments   Labs (all labs ordered are listed, but only abnormal results are displayed) Labs Reviewed  RESP PANEL BY RT-PCR (RSV, FLU A&B, COVID)  [id number]  GROUP A STREP BY PCR      PROCEDURES:  Critical Care performed:   Procedures   MEDICATIONS ORDERED IN ED: Medications - No data to display Clinical Course as of 03/27/24 1846  Mon Mar 27, 2024  1825 Resp panel by RT-PCR (RSV, Flu A&B, Covid) Anterior Nasal Swab Negative [AE]  1825 Group A Strep by PCR ([facility] Only) Negative [AE]    Clinical Course User Index [AE] Janit Kast, PA-C    IMPRESSION / MDM / ASSESSMENT AND PLAN / ED COURSE  I reviewed the triage vital signs and the nursing notes.  Differential diagnosis includes, but is not limited to, sinus infection, otitis media, pneumonia, COVID, strep a  Patient's presentation is most consistent with acute complicated illness / injury requiring diagnostic workup.   Herbert Griffin is a 33 y.o., male who presents today with history of 4 days of nasal congestion, cough, body aches,  asthenia, adynamia, wet cough.  On physical exam tenderness to palpation in the frontal and maxillary sinuses,left preauricular area.  Peritonsillar erythema, tonsillar enlargement, postnasal drip.  Cardiopulmonary is clear.  Rest of physical exam is normal Patient's diagnosis is consistent with sinus infection. I did not order any imaging, physical exam is reassuring labs are  reassuring ruling out COVID, influenza, RSV, group A strep. I did review the patient's allergies and medications.The patient is in stable and satisfactory condition for discharge home  Patient will be discharged home with prescriptions for Augmentin. Patient is to follow up with PCP as needed or otherwise directed.  I did advise patient to use nasal rinse, and DayQuil for nasal congestion.  P.atient is given ED precautions to return to the ED for any worsening or new symptoms.  Work note will be provided Discussed plan of care with patient, answered all of patient's questions, Patient agreeable to plan of care. Advised patient to take medications according to the instructions on the label. Discussed possible side effects of new medications. Patient verbalized understanding.  FINAL CLINICAL IMPRESSION(S) / ED DIAGNOSES   Final diagnoses:  Subacute maxillary sinusitis     Rx / DC Orders   ED Discharge Orders          Ordered    amoxicillin-clavulanate (AUGMENTIN) 875-125 MG tablet  2 times daily        03/27/24 1845             Note:  This document was prepared using Dragon voice recognition software and may include unintentional dictation errors.   Janit Kast, PA-C 03/27/24 1846    Levander Slate, MD 03/27/24 [phone]

## 2024-03-27 NOTE — ED Triage Notes (Signed)
 Pt comes with cough, congestion, sore throat and pain all over. Pt states this started on Friday and was since gotten worse.

## 2024-04-25 ENCOUNTER — Emergency Department
Admission: EM | Admit: 2024-04-25 | Discharge: 2024-04-25 | Discharge status: Against Medical Advice | Payer: Self-pay | Attending: Emergency Medicine | Admitting: Emergency Medicine

## 2024-04-25 ENCOUNTER — Other Ambulatory Visit: Payer: Self-pay

## 2024-04-25 DIAGNOSIS — Z5321 Procedure and treatment not carried out due to patient leaving prior to being seen by health care provider: Secondary | ICD-10-CM | POA: Insufficient documentation

## 2024-04-25 DIAGNOSIS — R21 Rash and other nonspecific skin eruption: Secondary | ICD-10-CM | POA: Insufficient documentation

## 2024-04-25 NOTE — ED Triage Notes (Signed)
 Pt comes for rash throughout thighs and bottom, legs, and lower back. Started about a month ago. Some redness and itchiness.

## 2024-04-26 ENCOUNTER — Other Ambulatory Visit: Payer: Self-pay

## 2024-04-26 ENCOUNTER — Encounter: Payer: Self-pay | Admitting: *Deleted

## 2024-04-26 ENCOUNTER — Emergency Department
Admission: EM | Admit: 2024-04-26 | Discharge: 2024-04-26 | Discharge status: Against Medical Advice | Payer: Self-pay | Attending: Emergency Medicine | Admitting: Emergency Medicine

## 2024-04-26 DIAGNOSIS — Z5321 Procedure and treatment not carried out due to patient leaving prior to being seen by health care provider: Secondary | ICD-10-CM | POA: Insufficient documentation

## 2024-04-26 DIAGNOSIS — R21 Rash and other nonspecific skin eruption: Secondary | ICD-10-CM | POA: Insufficient documentation

## 2024-04-26 NOTE — ED Triage Notes (Signed)
 Pt ambulatory to triage.  Pt has a rash on left lower leg, left flank area.  Pt has itching and burning.  Sx for 2 months.  Pt using cortisone cream without relief.  Pt was seen here last night but did not see a provider.  Pt alert  speech  clear.

## 2024-05-30 ENCOUNTER — Other Ambulatory Visit: Payer: Self-pay

## 2024-05-30 ENCOUNTER — Emergency Department
Admission: EM | Admit: 2024-05-30 | Discharge: 2024-05-30 | Disposition: A | Discharge status: Home / Self Care | Payer: Self-pay | Attending: Emergency Medicine | Admitting: Emergency Medicine

## 2024-05-30 DIAGNOSIS — R21 Rash and other nonspecific skin eruption: Secondary | ICD-10-CM

## 2024-05-30 MED ORDER — TRIAMCINOLONE ACETONIDE 0.025 % EX OINT: 1.0000 | TOPICAL_OINTMENT | Freq: Two times a day (BID) | CUTANEOUS | 0 refills | Status: AC

## 2024-05-30 NOTE — Discharge Instructions (Addendum)
 Please follow-up with dermatology.  You may use the ointment in the meantime.  Please return for any new, worsening, or changing symptoms or other concerns.  It was a pleasure caring for you today.

## 2024-05-30 NOTE — ED Triage Notes (Signed)
 Pt to ED via POV from home. Pt reports rash all over body that started in July. Pt reports has been here before but left before being seen.

## 2024-05-30 NOTE — ED Provider Notes (Signed)
 [facility] Provider Note    Event Date/Time   First MD Initiated Contact with Patient 05/30/24 1100     (approximate)   History   Rash   HPI  Herbert Griffin is a 33 y.o. male who presents today for evaluation of itchy rash to his left side and his inner leg.  He reports that this has been present for many months.  He denies any known new exposures.  He has not noticed any vesicles.  No fevers or chills.  No intraoral abnormalities.  It has not spread.  No easy bruising, no bleeding.  The rash is not painful, it is only itchy.  No difficulty breathing or swallowing.  Patient Active Problem List   Diagnosis Date Noted   Psychiatric diagnosis 09/29/2019   Psychiatric exam requested by authority 09/29/2019   Elevated TSH 11/23/2017   Elevated total protein 11/23/2017   Leukopenia 11/16/2017   Thrombocytopenia 11/16/2017   Substance induced mood disorder (HCC) 08/27/2016   Benzodiazepine abuse (HCC) 08/27/2016          Physical Exam   Triage Vital Signs: ED Triage Vitals  Encounter Vitals Group     BP 05/30/24 1055 118/80     Girls Systolic BP Percentile --      Girls Diastolic BP Percentile --      Boys Systolic BP Percentile --      Boys Diastolic BP Percentile --      Pulse Rate 05/30/24 1054 80     Resp 05/30/24 1054 18     Temp 05/30/24 1054 97.6 F (36.4 C)     Temp Source 05/30/24 1054 Oral     SpO2 05/30/24 1054 98 %     Weight --      Height --      Head Circumference --      Peak Flow --      Pain Score 05/30/24 1054 10     Pain Loc --      Pain Education --      Exclude from Growth Chart --     Most recent vital signs: Vitals:   05/30/24 1054 05/30/24 1055  BP:  118/80  Pulse: 80   Resp: 18   Temp: 97.6 F (36.4 C)   SpO2: 98%     Physical Exam Vitals and nursing note reviewed.  Constitutional:      General: Awake and alert. No acute distress.    Appearance: Normal appearance. The patient is normal weight.   HENT:     Head: Normocephalic and atraumatic.     Mouth: Mucous membranes are moist.  Eyes:     General: PERRL. Normal EOMs        Right eye: No discharge.        Left eye: No discharge.     Conjunctiva/sclera: Conjunctivae normal.  Cardiovascular:     Rate and Rhythm: Normal rate and regular rhythm.     Pulses: Normal pulses.     Heart sounds: Normal heart sounds Pulmonary:     Effort: Pulmonary effort is normal. No respiratory distress.     Breath sounds: Normal breath sounds.  Abdominal:     Abdomen is soft. There is no abdominal tenderness. No rebound or guarding. No distention. Musculoskeletal:        General: No swelling. Normal range of motion.     Cervical back: Normal range of motion and neck supple.  Skin:    General: Skin is warm  and dry.     Capillary Refill: Capillary refill takes less than 2 seconds.     Findings: Dried thickened skin rash noted to left truncal side, as well as left inner thigh.  No vesicles.  No surrounding erythema.  No open areas.  Nontender to palpation.  Not warm to touch. Neurological:     Mental Status: The patient is awake and alert.         ED Results / Procedures / Treatments   Labs (all labs ordered are listed, but only abnormal results are displayed) Labs Reviewed - No data to display   EKG     RADIOLOGY     PROCEDURES:  Critical Care performed:   Procedures   MEDICATIONS ORDERED IN ED: Medications - No data to display   IMPRESSION / MDM / ASSESSMENT AND PLAN / ED COURSE  I reviewed the triage vital signs and the nursing notes.   Differential diagnosis includes, but is not limited to, contact dermatitis, eczema, psoriasis.  Patient is awake and alert, hemodynamically stable and afebrile.  The rash has been ongoing for the past several months.  There is no erythema, pain, or vesicles.  Does not appear to be infectious in nature nor does it appear to be shingles.  Reports that it is primarily itchy.  No  urticaria.  No difficulty breathing or swallowing.  No mucosal involvement.  Will start him on a triamcinolone  though I recommended outpatient follow-up with dermatology.  The appropriate follow-up information was provided.  We discussed return precautions in the meantime.  Patient understands and agrees with plan.  Discharged in stable condition.    Patient's presentation is most consistent with acute, uncomplicated illness.     FINAL CLINICAL IMPRESSION(S) / ED DIAGNOSES   Final diagnoses:  Rash     Rx / DC Orders   ED Discharge Orders          Ordered    triamcinolone  (KENALOG ) 0.025 % ointment  2 times daily        05/30/24 1109             Note:  This document was prepared using Dragon voice recognition software and may include unintentional dictation errors.   Amyria Komar E, PA-C 05/30/24 1502    Willo Dunnings, MD 05/30/24 [phone]

## 2024-06-24 ENCOUNTER — Other Ambulatory Visit: Payer: Self-pay

## 2024-06-24 ENCOUNTER — Emergency Department: Payer: Self-pay

## 2024-06-24 ENCOUNTER — Emergency Department
Admission: EM | Admit: 2024-06-24 | Discharge: 2024-06-24 | Disposition: A | Discharge status: Home / Self Care | Payer: Self-pay

## 2024-06-24 DIAGNOSIS — R1031 Right lower quadrant pain: Secondary | ICD-10-CM

## 2024-06-24 DIAGNOSIS — A55 Chlamydial lymphogranuloma (venereum): Secondary | ICD-10-CM | POA: Insufficient documentation

## 2024-06-24 LAB — BASIC METABOLIC PANEL WITH GFR
Anion gap: 7 (ref 5–15)
BUN: 21 mg/dL — ABNORMAL HIGH (ref 6–20)
CO2: 31 mmol/L (ref 22–32)
Calcium: 9.4 mg/dL (ref 8.9–10.3)
Chloride: 104 mmol/L (ref 98–111)
Creatinine, Ser: 0.94 mg/dL (ref 0.61–1.24)
GFR, Estimated: >60 mL/min
Glucose, Bld: 82 mg/dL (ref 70–99)
Potassium: 4.5 mmol/L (ref 3.5–5.1)
Sodium: 142 mmol/L (ref 135–145)

## 2024-06-24 LAB — CBC
HCT: 42.3 % (ref 39.0–52.0)
Hemoglobin: 14 g/dL (ref 13.0–17.0)
MCH: 30.2 pg (ref 26.0–34.0)
MCHC: 33.1 g/dL (ref 30.0–36.0)
MCV: 91.4 fL (ref 80.0–100.0)
Platelets: 153 K/uL (ref 150–400)
RBC: 4.63 MIL/uL (ref 4.22–5.81)
RDW: 13.9 % (ref 11.5–15.5)
WBC: 17.5 K/uL — ABNORMAL HIGH (ref 4.0–10.5)
nRBC: 0 % (ref 0.0–0.2)

## 2024-06-24 LAB — CHLAMYDIA/NGC RT PCR (ARMC ONLY)
Chlamydia Tr: NOT DETECTED
N gonorrhoeae: NOT DETECTED

## 2024-06-24 LAB — URINALYSIS, ROUTINE W REFLEX MICROSCOPIC
Bilirubin Urine: NEGATIVE
Glucose, UA: NEGATIVE mg/dL
Hgb urine dipstick: NEGATIVE
Ketones, ur: NEGATIVE mg/dL
Nitrite: NEGATIVE
Protein, ur: NEGATIVE mg/dL
Specific Gravity, Urine: 1.025 (ref 1.005–1.030)
pH: 7 (ref 5.0–8.0)

## 2024-06-24 MED ORDER — DEXTROSE 5 % IV SOLN: 500.0000 mg | Freq: Once | INTRAVENOUS | Status: DC

## 2024-06-24 MED ORDER — DOXYCYCLINE HYCLATE 100 MG PO TABS: 100.0000 mg | ORAL_TABLET | Freq: Two times a day (BID) | ORAL | 0 refills | Status: DC

## 2024-06-24 MED ORDER — CEFTRIAXONE SODIUM 1 G IJ SOLR
500.0000 mg | Freq: Once | INTRAMUSCULAR | Status: DC
  Filled 2024-06-24: qty 10

## 2024-06-24 MED ORDER — DOXYCYCLINE HYCLATE 100 MG PO TABS: 100.0000 mg | ORAL_TABLET | Freq: Two times a day (BID) | ORAL | 0 refills | Status: AC

## 2024-06-24 MED ORDER — DOXYCYCLINE HYCLATE 100 MG PO TABS
100.0000 mg | ORAL_TABLET | Freq: Once | ORAL | Status: AC
  Administered 2024-06-24: 100 mg via ORAL
  Filled 2024-06-24: qty 1

## 2024-06-24 MED ORDER — IOHEXOL 300 MG/ML  SOLN
100.0000 mL | Freq: Once | INTRAMUSCULAR | Status: AC | PRN
  Administered 2024-06-24: 100 mL via INTRAVENOUS

## 2024-06-24 MED ORDER — CEFTRIAXONE SODIUM 1 G IJ SOLR
500.0000 mg | Freq: Once | INTRAMUSCULAR | Status: AC
  Administered 2024-06-24: 500 mg via INTRAMUSCULAR

## 2024-06-24 MED ORDER — CIPROFLOXACIN HCL 500 MG PO TABS: 500.0000 mg | ORAL_TABLET | Freq: Two times a day (BID) | ORAL | 0 refills | Status: AC

## 2024-06-24 NOTE — Discharge Instructions (Addendum)
 Please take antibiotics as prescribed.  Return to the ER if you develop any fevers increasing pain worsening symptoms or urgent changes in your health.

## 2024-06-24 NOTE — ED Provider Notes (Signed)
 " [place] [facility] Provider Note   CSN: [id number] Arrival date & time: 06/24/24  1509     Patient presents with: Groin Swelling   Herbert Griffin is a 34 y.o. male with history of STDs in the past presents with groin swelling.  He has had 1 day of swelling and pain in his right groin.  No fevers or chills.  No urinary symptoms, scrotal pain, swelling.  No penile discharge.  He denies any rashes or lesions.  He has moderate pain along the right groin that began today.  He performs repetitive heavy lifting at work.  No history of hernias.  Does not notice much change in pain whether he is standing or lying   HPI     Prior to Admission medications  Medication Sig Start Date End Date Taking? Authorizing Provider  ciprofloxacin  (CIPRO ) 500 MG tablet Take 1 tablet (500 mg total) by mouth 2 (two) times daily for 5 days. 06/24/24 06/29/24 Yes Charlene Debby BROCKS, PA-C  albuterol  (VENTOLIN  HFA) 108 (90 Base) MCG/ACT inhaler Inhale 2 puffs into the lungs every 6 (six) hours as needed for wheezing or shortness of breath. Patient not taking: Reported on 08/13/2023 07/08/22   Woods, Jaclyn M, PA-C  doxycycline  (VIBRA -TABS) 100 MG tablet Take 1 tablet (100 mg total) by mouth 2 (two) times daily for 21 days. Please cancel rx for doxycycline  100 mg twice daily 7 days 06/24/24 07/15/24  Charlene Debby BROCKS, PA-C  ibuprofen  (ADVIL ) 600 MG tablet Take 1 tablet (600 mg total) by mouth every 8 (eight) hours as needed. Patient not taking: Reported on 08/13/2023 01/15/20   Sung, Jade J, MD  levETIRAcetam  (KEPPRA ) 500 MG tablet Take 1 tablet (500 mg total) by mouth 2 (two) times daily. Patient not taking: Reported on 08/13/2023 05/05/18   Sofia, Leslie K, PA-C  triamcinolone  (KENALOG ) 0.025 % ointment Apply 1 Application topically 2 (two) times daily. 05/30/24   Poggi, Jenna E, PA-C    Allergies: Patient has no known allergies.    Review of Systems  Updated Vital Signs BP 131/67 (BP  Location: Left Arm)   Pulse 76   Temp 98.7 F (37.1 C) (Oral)   Resp 16   Ht 6' (1.829 m)   Wt 73.5 kg   SpO2 98%   BMI 21.97 kg/m   Physical Exam Constitutional:      Appearance: He is well-developed.  HENT:     Head: Normocephalic and atraumatic.     Mouth/Throat:     Pharynx: Oropharynx is clear.  Eyes:     Conjunctiva/sclera: Conjunctivae normal.  Cardiovascular:     Rate and Rhythm: Normal rate.     Pulses: Normal pulses.     Heart sounds: Normal heart sounds.  Pulmonary:     Effort: Pulmonary effort is normal. No respiratory distress.  Abdominal:     General: There is no distension.     Tenderness: There is no abdominal tenderness. There is no guarding.     Comments: Mild tenderness swelling right inguinal region, increased bulge slightly with coughing.  No increase in size with Valsalva.  No warmth redness or fevers  Musculoskeletal:        General: Normal range of motion.     Cervical back: Normal range of motion.  Skin:    General: Skin is warm.     Findings: No rash.  Neurological:     General: No focal deficit present.     Mental Status:  He is alert and oriented to person, place, and time.  Psychiatric:        Mood and Affect: Mood normal.        Behavior: Behavior normal.        Thought Content: Thought content normal.     (all labs ordered are listed, but only abnormal results are displayed) Labs Reviewed  URINALYSIS, ROUTINE W REFLEX MICROSCOPIC - Abnormal; Notable for the following components:      Result Value   Color, Urine YELLOW (*)    APPearance CLOUDY (*)    Leukocytes,Ua SMALL (*)    Bacteria, UA MANY (*)    All other components within normal limits  CBC - Abnormal; Notable for the following components:   WBC 17.5 (*)    All other components within normal limits  BASIC METABOLIC PANEL WITH GFR - Abnormal; Notable for the following components:   BUN 21 (*)    All other components within normal limits  CHLAMYDIA/NGC RT PCR ([facility] ONLY)               EKG: None  Radiology: CT PELVIS W CONTRAST Result Date: 06/24/2024 EXAM: CT PELVIS, WITH IV CONTRAST 06/24/2024 07:20:11 PM TECHNIQUE: Axial images were acquired through the pelvis with IV contrast. Reformatted images were reviewed. Automated exposure control, iterative reconstruction, and/or weight based adjustment of the mA/kV was utilized to reduce the radiation dose to as low as reasonably achievable. COMPARISON: None available. CLINICAL HISTORY: Hernia suspected, inguinal or femoral; Right groin pain, swelling. FINDINGS: BONES: No acute fracture or focal osseous lesion. JOINTS: No dislocation. The joint spaces are normal. SOFT TISSUES: Trace right inguinal soft tissue edema. No emphysema. INTRAPELVIC CONTENTS: Nonspecific trace simple free pelvic fluid. No evidence of fluid collection. IMPRESSION: 1. Trace right inguinal soft tissue edema without fluid collection or emphysema. Electronically signed by: Morgane Naveau MD MD 06/24/2024 07:34 PM EST RP Workstation: [id number]     Procedures   Medications Ordered in the ED  cefTRIAXone  (ROCEPHIN ) injection 500 mg (has no administration in time range)  iohexol  (OMNIPAQUE ) 300 MG/ML solution 100 mL (100 mLs Intravenous Contrast Given 06/24/24 1914)  doxycycline  (VIBRA -TABS) tablet 100 mg (100 mg Oral Given 06/24/24 2011)                                    Medical Decision Making Amount and/or Complexity of Data Reviewed Labs: ordered. Radiology: ordered.  Risk Prescription drug management.   34 year old male with 1 day of pain on the right groin with some mild soft tissue swelling.  Not quite consistent with inguinal hernia but patient concerned.  CT scan showed no signs of hernia but some mild soft tissue swelling with no fluid collection or emphysema.  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  Urine did show some WBCs and leukocytes.  Urine gonorrhea chlamydia test was negative.  Will go ahead and treat with Rocephin ,  doxycycline  and Cipro .  He is afebrile.  Slightly elevated white count.  He is educated on signs and symptoms return to ER for such as any increasing pain fevers.  He appears comfortable.  Final diagnoses:  Right groin pain  Lymphogranuloma venereum    ED Discharge Orders          Ordered    doxycycline  (VIBRA -TABS) 100 MG tablet  2 times daily,   Status:  Discontinued        06/24/24 2000    doxycycline  (  VIBRA -TABS) 100 MG tablet  2 times daily        06/24/24 2014    ciprofloxacin  (CIPRO ) 500 MG tablet  2 times daily        06/24/24 2030               Charlene Debby BROCKS, [place] 06/24/24 2040  "

## 2024-06-24 NOTE — ED Triage Notes (Signed)
 Pt to ED with R groin swelling since this AM. States he thinks he could have a hernia. Denies dysuria, injuries. Denies testicular swelling. States pain is 10/10. Pt is ambulatory. Sent basic lab sin case ordered.

## 2024-07-07 ENCOUNTER — Other Ambulatory Visit: Payer: Self-pay

## 2024-07-07 ENCOUNTER — Emergency Department
Admission: EM | Admit: 2024-07-07 | Discharge: 2024-07-08 | Disposition: A | Payer: Self-pay | Attending: Emergency Medicine | Admitting: Emergency Medicine

## 2024-07-07 DIAGNOSIS — R55 Syncope and collapse: Secondary | ICD-10-CM | POA: Insufficient documentation

## 2024-07-07 DIAGNOSIS — F1910 Other psychoactive substance abuse, uncomplicated: Secondary | ICD-10-CM

## 2024-07-07 DIAGNOSIS — D72829 Elevated white blood cell count, unspecified: Secondary | ICD-10-CM | POA: Insufficient documentation

## 2024-07-07 MED ORDER — SODIUM CHLORIDE 0.9 % IV BOLUS (SEPSIS)
1000.0000 mL | Freq: Once | INTRAVENOUS | Status: AC
  Administered 2024-07-08: 1000 mL via INTRAVENOUS

## 2024-07-07 NOTE — ED Notes (Signed)
 Patient ambulated with steady, even gait to restroom.

## 2024-07-07 NOTE — ED Provider Notes (Signed)
 "  [facility] Provider Note    Event Date/Time   First MD Initiated Contact with Patient 07/07/24 2300     (approximate)   History   Ingestion   HPI  Herbert Griffin is a 34 y.o. male with history of seizures, benzodiazepine abuse, marijuana use who presents to the emergency department after an episode where he became diaphoretic and had a brief episode of syncope.  He states this was witnessed by family and police and they deny any seizure-like activity.  No lip or tongue injury.  No bowel or bladder incontinence.  He states that he did take what he thought was a 2 mg Xanax around 7 PM tonight.  He states that he is not prescribed these medications and that I abuse them.  He states that around 10 PM he started feeling poorly and became diaphoretic and passed out.  States he is now feeling better.  No chest pain or shortness of breath.  No fevers, cough, vomiting or diarrhea.  He denies any other illicit drug use, alcohol use.  He denies that this was an attempt to harm himself and has no SI.  He does report having some leg cramping but no leg swelling.  Has never had a PE or DVT.  States he recently has been on doxycycline  as he is being treated for chlamydia.   History provided by patient.    Past Medical History:  Diagnosis Date   Seizures (HCC)     History reviewed. No pertinent surgical history.  MEDICATIONS:  Prior to Admission medications  Medication Sig Start Date End Date Taking? Authorizing Provider  albuterol  (VENTOLIN  HFA) 108 (90 Base) MCG/ACT inhaler Inhale 2 puffs into the lungs every 6 (six) hours as needed for wheezing or shortness of breath. Patient not taking: Reported on 08/13/2023 07/08/22   Woods, Jaclyn M, PA-C  doxycycline  (VIBRA -TABS) 100 MG tablet Take 1 tablet (100 mg total) by mouth 2 (two) times daily for 21 days. Please cancel rx for doxycycline  100 mg twice daily 7 days 06/24/24 07/15/24  Charlene Debby BROCKS, PA-C  ibuprofen   (ADVIL ) 600 MG tablet Take 1 tablet (600 mg total) by mouth every 8 (eight) hours as needed. Patient not taking: Reported on 08/13/2023 01/15/20   Sung, Jade J, MD  levETIRAcetam  (KEPPRA ) 500 MG tablet Take 1 tablet (500 mg total) by mouth 2 (two) times daily. Patient not taking: Reported on 08/13/2023 05/05/18   Sofia, Leslie K, PA-C  triamcinolone  (KENALOG ) 0.025 % ointment Apply 1 Application topically 2 (two) times daily. 05/30/24   Poggi, Jenna E, PA-C    Physical Exam   Triage Vital Signs: ED Triage Vitals [07/07/24 2231]  Encounter Vitals Group     BP      Girls Systolic BP Percentile      Girls Diastolic BP Percentile      Boys Systolic BP Percentile      Boys Diastolic BP Percentile      Pulse Rate 80     Resp 17     Temp 97.6 F (36.4 C)     Temp Source Oral     SpO2 98 %     Weight 165 lb 5.5 oz (75 kg)     Height 6' (1.829 m)     Head Circumference      Peak Flow      Pain Score 7     Pain Loc      Pain Education  Exclude from Growth Chart     Most recent vital signs: Vitals:   07/07/24 2231 07/07/24 2358  BP:  (!) 154/80  Pulse: 80   Resp: 17   Temp: 97.6 F (36.4 C)   SpO2: 98%     CONSTITUTIONAL: Alert, responds appropriately to questions. Well-appearing; well-nourished HEAD: Normocephalic, atraumatic EYES: Conjunctivae clear, pupils appear equal, sclera nonicteric ENT: normal nose; moist mucous membranes NECK: Supple, normal ROM CARD: RRR; S1 and S2 appreciated RESP: Normal chest excursion without splinting or tachypnea; breath sounds clear and equal bilaterally; no wheezes, no rhonchi, no rales, no hypoxia or respiratory distress, speaking full sentences ABD/GI: Non-distended; soft, non-tender, no rebound, no guarding, no peritoneal signs BACK: The back appears normal EXT: Normal ROM in all joints; no deformity noted, no edema, no calf tenderness or calf swelling SKIN: Normal color for age and race; warm; no rash on exposed skin NEURO: Moves all  extremities equally, normal speech PSYCH: The patient's mood and manner are appropriate.  Denies SI.   ED Results / Procedures / Treatments   LABS: (all labs ordered are listed, but only abnormal results are displayed) Labs Reviewed  COMPREHENSIVE METABOLIC PANEL WITH GFR - Abnormal; Notable for the following components:      Result Value   BUN 22 (*)    All other components within normal limits  CBC - Abnormal; Notable for the following components:   WBC 15.8 (*)    Platelets 121 (*)    All other components within normal limits  URINALYSIS, ROUTINE W REFLEX MICROSCOPIC - Abnormal; Notable for the following components:   Color, Urine YELLOW (*)    APPearance HAZY (*)    Protein, ur 30 (*)    Bacteria, UA RARE (*)    All other components within normal limits  ACETAMINOPHEN  LEVEL - Abnormal; Notable for the following components:   Acetaminophen  (Tylenol ), Serum <10 (*)    All other components within normal limits  SALICYLATE LEVEL - Abnormal; Notable for the following components:   Salicylate Lvl <7.0 (*)    All other components within normal limits  URINE DRUG SCREEN - Abnormal; Notable for the following components:   Cocaine POSITIVE (*)    Tetrahydrocannabinol POSITIVE (*)    All other components within normal limits  ETHANOL  CK  MAGNESIUM  D-DIMER, QUANTITATIVE  LIPASE, BLOOD  CBG MONITORING, ED  TROPONIN T, HIGH SENSITIVITY  TROPONIN T, HIGH SENSITIVITY     EKG:  EKG Interpretation Date/Time:  Saturday July 08 2024 00:38:41 EST Ventricular Rate:  54 PR Interval:  194 QRS Duration:  90 QT Interval:  380 QTC Calculation: 360 R Axis:   77  Text Interpretation: Sinus bradycardia Nonspecific ST and T wave abnormality Abnormal ECG When compared with ECG of 11-Nov-2022 21:06, PREVIOUS ECG IS PRESENT Confirmed by Neomi Neptune [phone]) on 07/08/2024 1:03:19 AM         RADIOLOGY: My personal review and interpretation of imaging:    I have personally  reviewed all radiology reports.   No results found.   PROCEDURES:  Critical Care performed: No     .1-3 Lead EKG Interpretation  Performed by: Lyle Leisner, Neptune SAILOR, DO Authorized by: Manmeet Arzola, Neptune SAILOR, DO     Interpretation: normal     ECG rate:  80   ECG rate assessment: normal     Rhythm: sinus rhythm     Ectopy: none     Conduction: normal       IMPRESSION / MDM /  ASSESSMENT AND PLAN / ED COURSE  I reviewed the triage vital signs and the nursing notes.    Patient here with episode of syncope, diaphoresis that occurred about 3 hours after taking what he thought was a 2 mg Xanax orally.  Also reports occasional marijuana use but no other illicit drug or alcohol use.  Denies SI at this was a suicide attempt.  The patient is on the cardiac monitor to evaluate for evidence of arrhythmia and/or significant heart rate changes.   DIFFERENTIAL DIAGNOSIS (includes but not limited to):   Benzodiazepine overdose, other illicit drug ingestion, denies suicide attempt, differential also includes anemia, electrolyte derangement, rhabdomyolysis, dehydration, vasovagal syncope, arrhythmia, ACS, PE   Patient's presentation is most consistent with acute presentation with potential threat to life or bodily function.   PLAN: Will obtain labs, urine.  EKG shows no ischemia, arrhythmia or interval change.  Will give IV fluids and allow him to eat and drink.   MEDICATIONS GIVEN IN ED: Medications  sodium chloride  0.9 % bolus 1,000 mL (0 mLs Intravenous Stopped 07/08/24 0103)     ED COURSE: Patient's hemoglobin today is normal.  Normal electrolytes.  Mild leukocytosis but he denies any infectious symptoms.  This may be reactive.  Troponin x 2 negative.  D-dimer negative.  Normal CK.  Negative Tylenol  and salicylate level.  Ethanol level negative.  Drug screen is positive for cocaine and THC.  He states he did not willingly ingest cocaine.  Suspect that this could be the cause of his symptoms as  previous drug screens have never shown that he has been cocaine positive.  Interestingly though his benzodiazepines are negative.  He is feeling better and feels comfortable with plan for discharge home.  He denies again any SI.  No indication for emergent psychiatric treatment.  Suspect syncope in relation to cocaine use but normal troponins here, current normal vital signs, patient feeling better.   At this time, I do not feel there is any life-threatening condition present. I reviewed all nursing notes, vitals, pertinent previous records.  All lab and urine results, EKGs, imaging ordered have been independently reviewed and interpreted by myself.  I reviewed all available radiology reports from any imaging ordered this visit.  Based on my assessment, I feel the patient is safe to be discharged home without further emergent workup and can continue workup as an outpatient as needed. Discussed all findings, treatment plan as well as usual and customary return precautions.  They verbalize understanding and are comfortable with this plan.  Outpatient follow-up has been provided as needed.  All questions have been answered.    CONSULTS:  none   OUTSIDE RECORDS REVIEWED: Reviewed previous infectious disease notes.       FINAL CLINICAL IMPRESSION(S) / ED DIAGNOSES   Final diagnoses:  Syncope, unspecified syncope type     Rx / DC Orders   ED Discharge Orders          Ordered    Ambulatory Referral to Primary Care (Establish Care)        07/08/24 0126             Note:  This document was prepared using Dragon voice recognition software and may include unintentional dictation errors.   Nygel Prokop, Josette SAILOR, DO 07/08/24 [phone]  "

## 2024-07-07 NOTE — ED Triage Notes (Signed)
 Pt was at a traffic stop and began to feel anxious so he took 2mg  of Xanex. States after, he began to feel dizzy and diaphoretic. Denies other drug or etoh use. Endorsing hx seizures (not medicated). At this time, endorsing abdominal pain and nausea.

## 2024-07-08 LAB — LIPASE, BLOOD: Lipase: 27 U/L (ref 11–51)

## 2024-07-08 LAB — URINALYSIS, ROUTINE W REFLEX MICROSCOPIC
Bilirubin Urine: NEGATIVE
Glucose, UA: NEGATIVE mg/dL
Hgb urine dipstick: NEGATIVE
Ketones, ur: NEGATIVE mg/dL
Leukocytes,Ua: NEGATIVE
Nitrite: NEGATIVE
Protein, ur: 30 mg/dL — AB
RBC / HPF: 0 RBC/hpf (ref 0–5)
Specific Gravity, Urine: 1.029 (ref 1.005–1.030)
pH: 6 (ref 5.0–8.0)

## 2024-07-08 LAB — CBC
HCT: 41.2 % (ref 39.0–52.0)
Hemoglobin: 13.8 g/dL (ref 13.0–17.0)
MCH: 30.3 pg (ref 26.0–34.0)
MCHC: 33.5 g/dL (ref 30.0–36.0)
MCV: 90.4 fL (ref 80.0–100.0)
Platelets: 121 10*3/uL — ABNORMAL LOW (ref 150–400)
RBC: 4.56 MIL/uL (ref 4.22–5.81)
RDW: 13.8 % (ref 11.5–15.5)
WBC: 15.8 10*3/uL — ABNORMAL HIGH (ref 4.0–10.5)
nRBC: 0 % (ref 0.0–0.2)

## 2024-07-08 LAB — COMPREHENSIVE METABOLIC PANEL WITH GFR
ALT: 14 U/L (ref 0–44)
AST: 21 U/L (ref 15–41)
Albumin: 4.3 g/dL (ref 3.5–5.0)
Alkaline Phosphatase: 77 U/L (ref 38–126)
Anion gap: 11 (ref 5–15)
BUN: 22 mg/dL — ABNORMAL HIGH (ref 6–20)
CO2: 25 mmol/L (ref 22–32)
Calcium: 9 mg/dL (ref 8.9–10.3)
Chloride: 103 mmol/L (ref 98–111)
Creatinine, Ser: 1.01 mg/dL (ref 0.61–1.24)
GFR, Estimated: >60 mL/min
Glucose, Bld: 91 mg/dL (ref 70–99)
Potassium: 4 mmol/L (ref 3.5–5.1)
Sodium: 139 mmol/L (ref 135–145)
Total Bilirubin: 0.3 mg/dL (ref 0.0–1.2)
Total Protein: 6.8 g/dL (ref 6.5–8.1)

## 2024-07-08 LAB — URINE DRUG SCREEN
Amphetamines: NEGATIVE
Barbiturates: NEGATIVE
Benzodiazepines: NEGATIVE
Cocaine: POSITIVE — AB
Fentanyl: NEGATIVE
Methadone Scn, Ur: NEGATIVE
Opiates: NEGATIVE
Tetrahydrocannabinol: POSITIVE — AB

## 2024-07-08 LAB — SALICYLATE LEVEL: Salicylate Lvl: <7 mg/dL — ABNORMAL LOW (ref 7.0–30.0)

## 2024-07-08 LAB — D-DIMER, QUANTITATIVE: D-Dimer, Quant: <0.27 ug{FEU}/mL (ref 0.00–0.50)

## 2024-07-08 LAB — CK: Total CK: 136 U/L (ref 49–397)

## 2024-07-08 LAB — TROPONIN T, HIGH SENSITIVITY
Troponin T High Sensitivity: 8 ng/L (ref 0–19)
Troponin T High Sensitivity: 8 ng/L (ref 0–19)

## 2024-07-08 LAB — ACETAMINOPHEN LEVEL: Acetaminophen (Tylenol), Serum: <10 ug/mL — ABNORMAL LOW (ref 10–30)

## 2024-07-08 LAB — ETHANOL: Alcohol, Ethyl (B): <15 mg/dL

## 2024-07-08 LAB — MAGNESIUM: Magnesium: 2 mg/dL (ref 1.7–2.4)
